# Patient Record
Sex: Female | Born: 1960 | Race: White | Hispanic: No | Marital: Married | Smoking: Never smoker
Health system: Southern US, Community
[De-identification: ages and names within clinical notes are randomized; demographics above are authoritative.]

## PROBLEM LIST (undated history)

## (undated) ENCOUNTER — Emergency Department (HOSPITAL_COMMUNITY): Payer: Self-pay

## (undated) DIAGNOSIS — C4491 Basal cell carcinoma of skin, unspecified: Secondary | ICD-10-CM

## (undated) DIAGNOSIS — E785 Hyperlipidemia, unspecified: Secondary | ICD-10-CM

## (undated) DIAGNOSIS — K589 Irritable bowel syndrome without diarrhea: Secondary | ICD-10-CM

## (undated) HISTORY — DX: Irritable bowel syndrome, unspecified: K58.9

## (undated) HISTORY — DX: Basal cell carcinoma of skin, unspecified: C44.91

## (undated) HISTORY — PX: AUGMENTATION MAMMAPLASTY: SUR837

## (undated) HISTORY — PX: ABDOMINAL HYSTERECTOMY: SHX81

## (undated) HISTORY — DX: Hyperlipidemia, unspecified: E78.5

---

## 1998-01-05 ENCOUNTER — Ambulatory Visit (HOSPITAL_BASED_OUTPATIENT_CLINIC_OR_DEPARTMENT_OTHER): Admission: RE | Admit: 1998-01-05 | Discharge: 1998-01-05 | Payer: Self-pay | Admitting: Plastic Surgery

## 1998-03-26 ENCOUNTER — Ambulatory Visit (HOSPITAL_BASED_OUTPATIENT_CLINIC_OR_DEPARTMENT_OTHER): Admission: RE | Admit: 1998-03-26 | Discharge: 1998-03-26 | Payer: Self-pay | Admitting: Plastic Surgery

## 1999-08-06 ENCOUNTER — Ambulatory Visit (HOSPITAL_COMMUNITY): Admission: RE | Admit: 1999-08-06 | Discharge: 1999-08-06 | Payer: Self-pay | Admitting: Urology

## 1999-08-06 ENCOUNTER — Encounter: Payer: Self-pay | Admitting: Urology

## 2000-01-20 ENCOUNTER — Other Ambulatory Visit: Admission: RE | Admit: 2000-01-20 | Discharge: 2000-01-20 | Payer: Self-pay | Admitting: Plastic Surgery

## 2000-01-20 ENCOUNTER — Encounter (INDEPENDENT_AMBULATORY_CARE_PROVIDER_SITE_OTHER): Payer: Self-pay | Admitting: Specialist

## 2000-07-26 ENCOUNTER — Other Ambulatory Visit: Admission: RE | Admit: 2000-07-26 | Discharge: 2000-07-26 | Payer: Self-pay | Admitting: Obstetrics and Gynecology

## 2000-07-31 ENCOUNTER — Encounter: Admission: RE | Admit: 2000-07-31 | Discharge: 2000-07-31 | Payer: Self-pay | Admitting: Obstetrics and Gynecology

## 2000-07-31 ENCOUNTER — Encounter: Payer: Self-pay | Admitting: Obstetrics and Gynecology

## 2000-08-22 HISTORY — PX: LAPAROSCOPIC HYSTERECTOMY: SHX1926

## 2000-10-19 ENCOUNTER — Ambulatory Visit (HOSPITAL_COMMUNITY): Admission: RE | Admit: 2000-10-19 | Discharge: 2000-10-19 | Payer: Self-pay | Admitting: Gastroenterology

## 2001-03-29 ENCOUNTER — Encounter (INDEPENDENT_AMBULATORY_CARE_PROVIDER_SITE_OTHER): Payer: Self-pay

## 2001-03-29 ENCOUNTER — Observation Stay (HOSPITAL_COMMUNITY): Admission: RE | Admit: 2001-03-29 | Discharge: 2001-03-30 | Payer: Self-pay | Admitting: Obstetrics and Gynecology

## 2001-04-05 ENCOUNTER — Inpatient Hospital Stay (HOSPITAL_COMMUNITY): Admission: AD | Admit: 2001-04-05 | Discharge: 2001-04-08 | Payer: Self-pay | Admitting: Obstetrics and Gynecology

## 2001-10-08 ENCOUNTER — Encounter: Payer: Self-pay | Admitting: Emergency Medicine

## 2001-10-08 ENCOUNTER — Emergency Department (HOSPITAL_COMMUNITY): Admission: EM | Admit: 2001-10-08 | Discharge: 2001-10-08 | Payer: Self-pay | Admitting: Emergency Medicine

## 2004-05-17 ENCOUNTER — Ambulatory Visit (HOSPITAL_COMMUNITY): Admission: RE | Admit: 2004-05-17 | Discharge: 2004-05-17 | Payer: Self-pay | Admitting: Urology

## 2004-06-21 ENCOUNTER — Other Ambulatory Visit: Admission: RE | Admit: 2004-06-21 | Discharge: 2004-06-21 | Payer: Self-pay | Admitting: Obstetrics and Gynecology

## 2004-08-22 HISTORY — PX: LAPAROSCOPIC CHOLECYSTECTOMY: SUR755

## 2005-01-04 ENCOUNTER — Ambulatory Visit (HOSPITAL_COMMUNITY): Admission: RE | Admit: 2005-01-04 | Discharge: 2005-01-04 | Payer: Self-pay | Admitting: Gastroenterology

## 2005-01-14 ENCOUNTER — Ambulatory Visit (HOSPITAL_COMMUNITY): Admission: RE | Admit: 2005-01-14 | Discharge: 2005-01-14 | Payer: Self-pay | Admitting: Gastroenterology

## 2005-01-27 ENCOUNTER — Observation Stay (HOSPITAL_COMMUNITY): Admission: RE | Admit: 2005-01-27 | Discharge: 2005-01-28 | Payer: Self-pay | Admitting: Surgery

## 2005-01-27 ENCOUNTER — Encounter (INDEPENDENT_AMBULATORY_CARE_PROVIDER_SITE_OTHER): Payer: Self-pay | Admitting: *Deleted

## 2005-09-06 ENCOUNTER — Encounter: Admission: RE | Admit: 2005-09-06 | Discharge: 2005-09-06 | Payer: Self-pay | Admitting: Obstetrics and Gynecology

## 2006-06-20 ENCOUNTER — Encounter: Admission: RE | Admit: 2006-06-20 | Discharge: 2006-06-20 | Payer: Self-pay | Admitting: Gastroenterology

## 2006-09-07 ENCOUNTER — Encounter: Admission: RE | Admit: 2006-09-07 | Discharge: 2006-09-07 | Payer: Self-pay | Admitting: Obstetrics and Gynecology

## 2007-09-18 ENCOUNTER — Encounter: Admission: RE | Admit: 2007-09-18 | Discharge: 2007-09-18 | Payer: Self-pay | Admitting: Obstetrics and Gynecology

## 2007-10-22 ENCOUNTER — Ambulatory Visit (HOSPITAL_COMMUNITY): Admission: RE | Admit: 2007-10-22 | Discharge: 2007-10-22 | Payer: Self-pay | Admitting: Urology

## 2008-01-07 ENCOUNTER — Ambulatory Visit (HOSPITAL_COMMUNITY): Admission: RE | Admit: 2008-01-07 | Discharge: 2008-01-07 | Payer: Self-pay | Admitting: Urology

## 2008-06-13 ENCOUNTER — Ambulatory Visit (HOSPITAL_COMMUNITY): Admission: RE | Admit: 2008-06-13 | Discharge: 2008-06-13 | Payer: Self-pay | Admitting: Urology

## 2008-09-18 ENCOUNTER — Encounter: Admission: RE | Admit: 2008-09-18 | Discharge: 2008-09-18 | Payer: Self-pay | Admitting: Obstetrics and Gynecology

## 2009-03-06 ENCOUNTER — Ambulatory Visit: Payer: Self-pay | Admitting: Vascular Surgery

## 2009-06-19 ENCOUNTER — Ambulatory Visit: Payer: Self-pay | Admitting: Vascular Surgery

## 2009-08-05 ENCOUNTER — Ambulatory Visit: Payer: Self-pay | Admitting: Vascular Surgery

## 2009-08-11 ENCOUNTER — Ambulatory Visit: Payer: Self-pay | Admitting: Vascular Surgery

## 2009-09-21 ENCOUNTER — Encounter: Admission: RE | Admit: 2009-09-21 | Discharge: 2009-09-21 | Payer: Self-pay | Admitting: Obstetrics and Gynecology

## 2010-10-25 ENCOUNTER — Other Ambulatory Visit: Payer: Self-pay | Admitting: Obstetrics and Gynecology

## 2010-10-25 DIAGNOSIS — Z1231 Encounter for screening mammogram for malignant neoplasm of breast: Secondary | ICD-10-CM

## 2010-11-05 ENCOUNTER — Ambulatory Visit
Admission: RE | Admit: 2010-11-05 | Discharge: 2010-11-05 | Disposition: A | Discharge status: Home / Self Care | Payer: PRIVATE HEALTH INSURANCE | Source: Ambulatory Visit | Attending: Obstetrics and Gynecology | Admitting: Obstetrics and Gynecology

## 2010-11-05 DIAGNOSIS — Z1231 Encounter for screening mammogram for malignant neoplasm of breast: Secondary | ICD-10-CM

## 2011-01-04 NOTE — Procedures (Signed)
LOWER EXTREMITY VENOUS REFLUX EXAM   INDICATION:  Right lower extremity varicose vein with pain and swelling.   EXAM:  Using color-flow imaging and pulse spectral Doppler analysis, the  right common femoral vein, superficial femoral vein, popliteal,  posterior tibial, greater and lesser saphenous veins are evaluated.  There is no evidence suggesting deep venous insufficiency in the right  lower extremity.   The right saphenofemoral junction is competent.  The right GSV is  competent.   The right proximal short saphenous vein demonstrates incompetency.    GSV Diameter (used if found to be incompetent only)                                            Right    Left  Proximal Greater Saphenous Vein           cm       cm  Proximal-to-mid-thigh                     cm       cm  Mid thigh                                 cm       cm  Mid-distal thigh                          cm       cm  Distal thigh                              cm       cm  Knee                                      cm       cm    IMPRESSION:  1. No evidence of reflux noted in the right greater saphenous vein.  2. The right greater saphenous vein is not aneurysmal.  3. The right greater saphenous vein is not tortuous.  4. The deep venous system is competent.  5. The right lesser saphenous vein is not competent with reflux of      >500 milliseconds with the caliber ranging from 0.29 to 0.96 cm      distal thigh to popliteal fossa.  6. No evidence of DVT noted in the right leg.        ___________________________________________  Larina Earthly, M.D.   MG/MEDQ  D:  03/06/2009  T:  03/06/2009  Job:  [id number]

## 2011-01-04 NOTE — Procedures (Signed)
DUPLEX DEEP VENOUS EXAM - LOWER EXTREMITY   INDICATION:  Follow up right short saphenous vein ablation.   HISTORY:  Edema:  Trauma/Surgery:  Short saphenous vein ablation, 08/05/09 by Dr. Arbie Cookey.  Pain:  Minimal aching.  PE:  No.  Previous DVT:  No.  Anticoagulants:  No.  Other:   DUPLEX EXAM:                CFV   SFV   PopV  PTV    GSV                R  L  R  L  R  L  R   L  R  L  Thrombosis    o     o     o     o      o  Spontaneous   +     +     +     +      +  Phasic        +     +     +     +      +  Augmentation  +     +     +     +      +  Compressible  +     +     +     +      +  Competent     +     +     +     +      +   Legend:  + - yes  o - no  p - partial  D - decreased   IMPRESSION:  1. No evidence of deep venous thrombosis in the right lower extremity.  2. Right short saphenous vein shows evidence of ablation without flow      from proximal to mid.  3. 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    _____________________________  Quita Skye. Hart [place], M.D.   AS/MEDQ  D:  08/11/2009  T:  08/12/2009  Job:  [id number]

## 2011-01-04 NOTE — Assessment & Plan Note (Signed)
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  She underwent  uneventful ablation of her right small saphenous vein and phlebectomy of  multiple tributaries.  She had no immediate complication and will be  seen again in 1 week for continued followup.     Larina Earthly, M.D.  Electronically Signed   TFE/MEDQ  D:  08/05/2009  T:  08/06/2009  Job:  [id number]

## 2011-01-04 NOTE — Assessment & Plan Note (Signed)
OFFICE VISIT   EARLENE, BJELLAND A  DOB:  1961-07-18                                       06/19/2009  ZOXWR#:[id number]   Tamerra Merkley presents today for continued follow-up of her venous  hypertension and varicosities in her left leg.  She reports that the  compression garments have given her no relief.  She has several  components.  She does have specific pain over the varicosities  themselves in her posterior left calf and also has also a tired and achy  sensation of her calf with prolonged sitting or prolonged standing.  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   REVIEW OF SYSTEMS:  Otherwise unchanged.   PHYSICAL EXAMINATION:  She does have tributary varicosities over her  posterior calf.  I re-imaged this with ultrasound, and this does show  that this arises from her incompetent small saphenous vein.  I discussed  options with Ms. [place].  She clearly has failed conservative treatment.  I have recommended laser ablation of her small saphenous vein and stab  phlebectomy of her tributary varicosities.  I explained the procedure  including the outpatient nature of this under local and with the unusual  potential complication of DVT which would be extremely unlikely.  We  will schedule her at her convenience.   Larina Earthly, M.D.  Electronically Signed   TFE/MEDQ  D:  06/19/2009  T:  06/22/2009  Job:  [id number]

## 2011-01-04 NOTE — Consult Note (Signed)
NEW PATIENT CONSULTATION   Jasmine [name], Jasmine [name]  DOB:  [birth year]                                       03/06/2009  MVHQI#:[id number]   The patient presents today for evaluation of right calf venous  pathology.  She has had [name] long history of tributary varicosities over  her right calf and reported in the past that this has become  increasingly uncomfortable and in the past several days had marked pain  in her calf.  She is concerned regarding potential DVT and is seen for  further evaluation.  She does report aching sensation over this area and  also swelling and bulging of the veins in the posterior calf.  She  elevates her legs when possible.  She has not worn graduated compression  garments.   PAST MEDICAL HISTORY:  Otherwise, completely unremarkable.  No major  medical difficulties.  On no meds.   SOCIAL HISTORY:  She is married.  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  She does not smoke or drink alcohol on [name] regular basis.   PHYSICAL EXAM:  Well-developed, well-nourished white female appearing  stated age of 33.  Her radial and dorsalis pedis pulses are 2+  bilaterally.  She has no evidence of varicosities in her left leg.  In  her right leg, she does have [name] nest of varicosities in her right  posterior calf with some tenderness over these.   DIAGNOSTIC STUDIES:  She underwent venous duplex today and this reveals  reflux throughout her right small saphenous vein.  There is no evidence  of DVT and no evidence of deep venous valvular incompetence.   MEDICAL DECISION MAKING:  I discussed options with the patient.  I  explained that this pain is related with superficial venous  incompetence.  I explained the options, which would include elevation.  I did stress to her the importance of ibuprofen for discomfort, and also  fitted her with thigh high graduated compression garments, and  instructed her on the use of these.  We will see her again in 3  months  to determine if this conservative treatment is assisting in her pain  management.   Larina Earthly, M.D.  Electronically Signed   TFE/MEDQ  D:  03/06/2009  T:  03/09/2009  Job:  [id number]   cc:   Soyla Murphy. Renne Crigler, M.D.

## 2011-01-04 NOTE — Assessment & Plan Note (Signed)
OFFICE VISIT   [name], Jasmine A  DOB:  07-02-1961                                       08/11/2009  KVQQV#:[id number]   The patient returns today having had laser ablation of her right small  saphenous vein with multiple stab phlebectomies by Dr. Arbie Cookey 1 week ago.  She has had minimal discomfort and has been wearing elastic compression  stocking.  She has had no distal edema.  She is beginning to walk on the  treadmill with no problems.  On exam she has some bruising in the calf  as one would expect and all stab phlebectomy sites are healing nicely  with no distal edema.  Minimal tenderness over the small saphenous vein.  Venous duplex reveals no evidence of deep venous obstruction with total  ablation of the small saphenous vein.  She was reassured regarding these  findings and will continue wearing the stocking for at least one more  week and return to see [place] on a p.r.n. basis.     Quita Skye Hart [place], M.D.  Electronically Signed   JDL/MEDQ  D:  08/11/2009  T:  08/12/2009  Job:  [id number]

## 2011-01-07 NOTE — H&P (Signed)
[facility]  Patient:    Jasmine Green, Jasmine Green                      MRN: [id number] Adm. Date:  03/29/01 Attending:  Guy Sandifer. Arleta Creek, M.D.                         History and Physical  CHIEF COMPLAINT:              Uterine fibroid.  HISTORY OF PRESENT ILLNESS:   This patient is a 50 year old, white married female, G3, P3, husband status post vasectomy with known uterine leiomyomata. She is having progressively heavier menstrual flows. She changes a pad and a tampon every two hours. In addition, she is having significant increases in pelvic pain and dysmenorrhea. This takes her off of her feet during her menses. Ultrasound on March 08, 2001, revealed a 5.5 cm fibroid with central degeneration. Ovaries are essentially normal. After a careful discussion of the options, the patient is being admitted for a laparoscopically-assisted vaginal hysterectomy and removal of an ovary only if distinctly abnormal.  PAST MEDICAL HISTORY:         PVCs in the past.  PAST SURGICAL HISTORY:        Negative  OBSTETRIC HISTORY:            Cesarean section x 3.  SOCIAL HISTORY:               The patient denies tobacco, alcohol, or drug abuse.  CURRENT MEDICATIONS:          Elavil 10 mg p.o. q.h.s. p.r.n.  ALLERGIES:                    CODEINE.  REVIEW OF SYSTEMS:            Negative, except as above.  FAMILY HISTORY:               Prostate cancer in father. Coronary artery disease in father.  PHYSICAL EXAMINATION:  VITAL SIGNS:                  Height 5 feet 3 inches, weight 116 pounds, blood pressure 120/78.  HEENT/NECK:                   Without thyromegaly.  LUNGS:                        Clear to auscultation.  HEART:                        Regular rate and rhythm.  BACK:                         Without CVA tenderness.  BREASTS:                      Without mass, tracts, or discharge.  ABDOMEN:                      Soft, nontender, without  masses.  PELVIC:                       Vulva, vagina, cervix without lesion. Uterus is consistent with a 5 to 6 cm left fundal fibroid. Uterus is otherwise mobile. Adnexa nontender without masses.  EXTREMITIES:                  Grossly within normal limits.  NEUROLOGICAL:                 Grossly within normal limits.  ASSESSMENT:                   Uterine leiomyomata.  PLAN:                         Laparoscopically-assisted vaginal hysterectomy and removal of an ovary only if distinctly abnormal. DD:  03/27/01 TD:  03/27/01 Job: [id number]

## 2011-01-07 NOTE — Op Note (Signed)
[facility]  Patient:    Jasmine Green, Jasmine Green                    MRN: [id number] Proc. Date: 03/29/01 Adm. Date:  [id number] Attending:  Cordelia Pen Ii                           Operative Report  PREOPERATIVE DIAGNOSIS:       Uterine leiomyomata.  POSTOPERATIVE DIAGNOSES:      1. Uterine leiomyomata.                               2. Endometriosis.  PROCEDURE:                    Laparoscopically-assisted vaginal                               hysterectomy with ablation of endometriosis.  SURGEON:                      Guy Sandifer. Arleta Creek, M.D.  ASSISTANTWilley Blade, M.D.  ANESTHESIA:                   General with endotracheal intubation.  ESTIMATED BLOOD LOSS:         200 cc.  INDICATIONS AND CONSENT:      This patient is a 50 year old, married white female, G3, P3, husband status post vasectomy with known uterine leiomyomata. She has increasing pelvic pain, dysmenorrhea, and menorrhagia. Details are dictated in the history and physical. Laparoscopically-assisted vaginal hysterectomy with removal of an ovary only if distinctly abnormal is discussed. The possible risks and complications are discussed including, but not limited to, infection, bowel, bladder, ureteral damage, bleeding requiring transfusion of blood products with possible transfusion reaction, HIV and hepatitis acquisition, DVT, PE, pneumonia, fistula formation, postoperative dyspareunia, and laparotomy. All questions are answered and consent is signed on the chart.  FINDINGS:                     Upper abdomen is grossly normal. There are some filmy adhesions of the sigmoid to the upper left pelvic brim. The uterus is enlarged with intramural leiomyoma to approximately 10- to 12-week size. Ovaries and tubes are normal bilaterally. Anterior cul-de-sac contains some scarification secondary to previous cesarean sections along the vesicouterine peritoneum.  The pelvic sidewall contains some red, raised lesions of endometriosis on the left side well above the course of the ureter. On the right pelvic sidewall there is some white scarification, possibly secondary to endometriosis. In the posterior cul-de-sac there are several 3 to 5 mm red implants of endometriosis. Course of the ureters is normal bilaterally.  DESCRIPTION OF PROCEDURE:     The patient is taken to the operating room, placed in the dorsal supine position where general anesthesia is induced via endotracheal intubation. She is then placed in the dorsal lithotomy position where she is prepped abdominally and vaginally. A Hulka tenaculum is placed in the uterus as a manipulator and she is draped in the sterile fashion. A small infraumbilical incision is made and a 10/11 disposable trocar sleeve is placed on the first attempt  without difficulty. Placement is verified with the laparoscope and no damage to surrounding structures is noted. Pneumoperitoneum is induced and a suprapubic, and later left lower quadrant, incisions are made after careful transillumination and 5 mm nondisposable trocar sleeves are placed under direct visualization without difficulty. The above findings are noted. The areas of endometriosis are cauterized with bipolar cautery. The adhesions to the left pelvic brim are taken down sharply without difficulty. Then, using the 5 mm laparoscope through the left lower trocar sleeve, the proximal ligaments are taken down bilaterally with the LigaSure instrument. Good hemostasis is maintained. Then, switching back to the operative laparoscope, the vesicouterine peritoneum is incised in the midline, hydrodissected, and taken down bilaterally. Instruments are removed and attention is then turned to the vagina.  A weighted speculum is placed and the posterior cul-de-sac is entered sharply without difficulty. The cervix is circumscribed with a scalpel and the mucosa is  advanced sharply and bluntly. Again, using the LigaSure instrument, the uterosacral ligaments are taken in progressive bites bilaterally. The bladder pillars are then taken bilaterally. The anterior cul-de-sac is then entered sharply and bluntly without difficulty. The remainder of the cardinal ligaments and the uterine arteries are then taken bilaterally. Two additional bites above this level are taken bilaterally. The fundus is then eventually delivered posteriorly. The proximal ligaments are taken down with the LigaSure and the specimen is delivered. A wet tape is placed and the uterosacral ligaments are plicated to the vagina bilaterally. The ligaments are then plicated in the midline with a single suture. The cuff is then closed with figure-of-eight sutures. All sutures are 0 Monocryl unless otherwise designated. A Foley catheter is placed in the bladder and clear urine is noted. Then returning attention to the laparoscope, the pneumoperitoneum is reintroduced and a minor amount of oozing at the peritoneal edges is controlled with bipolar cautery. Copious irrigation is carried out and all the fluid returns as clear. The suprapubic and left lower trocar sleeves are removed and the pneumoperitoneum is reduced and no bleeding is noted from any site. The pneumoperitoneum is completely reduced and the umbilical trocar sleeve is removed. The umbilical incision is closed with a 0 Vicryl suture in the deeper underlying layers with great care being taken not to pick up any underlying structures. Skin incisions are then closed with a subcuticular 3-0 Vicryl suture. The incisions are injected with 0.5% plain Marcaine. All counts are correct. The patient is awakened and taken to the recovery room in stable condition. DD:  03/29/01 TD:  03/29/01 Job: [id number]

## 2011-01-07 NOTE — Procedures (Signed)
[facility]  Patient:    Jasmine Green, Jasmine Green                    MRN: [id number] Proc. Date: 10/19/00 Adm. Date:  [id number] Attending:  Charna Elizabeth CC:         Guy Sandifer. Arleta Creek, M.D.                           Procedure Report  DATE OF BIRTH:  [birth year].  PROCEDURE:  Colonoscopy.  ENDOSCOPIST:  Anselmo Rod, M.D.  INSTRUMENT USED:  Olympus video colonoscope.  INDICATION FOR PROCEDURE:  Trace guaiac-positive stools and abdominal pain with history of diarrhea alternating with constipation in a 50 year old white female.  Rule out colonic polyps, masses, hemorrhoids, etc.  PREPROCEDURE PREPARATION:  Informed consent was procured from the patient. The patient was fasted for eight hours prior to the procedure and prepped with Fleets Phospho-Soda the night of the procedure.  She also maintained herself on a liquid diet for 48 hours prior to the procedure.  PREPROCEDURE PHYSICAL:  VITAL SIGNS:  The patient had stable vital signs.  NECK:  Supple.  CHEST:  Clear to auscultation.  S1, S2 regular.  ABDOMEN:  Soft with normal abdominal bowel sounds.  DESCRIPTION OF PROCEDURE:  The patient was placed in the left lateral decubitus position and sedated with 100 mg of Demerol and 10 mg of Versed intravenously.  Once the patient was adequately sedate and maintained on low-flow oxygen and continuous cardiac monitoring, the Olympus video colonoscope was advanced from the rectum to the cecum with extreme difficulty secondary to a very tortuous colon and large amount of solid stool in the colon.  Multiple washes were done to facilitate adequate visualization of the mucosa.  The portions of the mucosa that were visualized seemed healthy. There was no evidence of erosions, ulcerations, masses, or polyps.  The procedure as complete to the cecum.  The terminal ileum was briefly visualized and appeared normal but as there was some stool in the TI as  well, very small lesions cannot be definitely ruled out.  The appendiceal orifice and ileocecal valve, the cecum, right colon, transverse colon, and rectal mucosa all appeared healthy.  There were small, nonbleeding internal hemorrhoids seen on retroflexion.  The patient tolerated the procedure well without complications.  IMPRESSION: 1. Very tortuous colon. 2. Large amount of residual stool in the colon.  Very small lesions can be    missed; however, no masses, polyps, erosions, ulcerations, or diverticula    were seen.  No evidence of inflammatory bowel disease, erosions,    ulcerations seen. 3. Terminal ileum briefly visualized.  Large amount of stool in the terminal    ileum.  No gross lesions seen; however, very small lesions could be missed.  RECOMMENDATIONS: 1. High-fiber diet with liberal fluid intake has been advocated. 2. I would refrain from recommending antispasmodics for this patient because    of a history of chronic constipation. 3. Patient may benefit from a stool softener at bedtime like Colace 100 mg per    night. 4. Outpatient follow-up is advised in the next two to four weeks. DD:  10/19/00 TD:  10/19/00 Job: [id number]

## 2011-01-07 NOTE — Op Note (Signed)
NAME:  Jasmine Green, Jasmine Green             ACCOUNT NO.:  [id number]   MEDICAL RECORD NO.:  [id number]          PATIENT TYPE:  AMB   LOCATION:  DAY                          FACILITY:  [facility]   PHYSICIAN:  Velora Heckler, MD      DATE OF BIRTH:  1960-08-24   DATE OF PROCEDURE:  01/27/2005  DATE OF DISCHARGE:                                 OPERATIVE REPORT   PREOPERATIVE DIAGNOSIS:  Biliary dyskinesia   POSTOPERATIVE DIAGNOSES:  Biliary dyskinesia, chronic cholecystitis   PROCEDURE:  Laparoscopic cholecystectomy with intraoperative cholangiography   SURGEON:  Velora Heckler, M.D.   ASSISTANT:  None.   ANESTHESIA:  General per Quentin Cornwall. Council Mechanic, M.D.   ESTIMATED BLOOD LOSS:  Minimal.   PREPARATION:  Betadine.   COMPLICATIONS:  None.   INDICATIONS:  The patient is a 50 year old white female referred by Anselmo Rod, M.D. for abdominal pain. The patient has had long-standing  irritable bowel syndrome. She has had some minor gastroesophageal reflux  symptoms. Recently, she has developed the sudden onset of epigastric  abdominal pain. This seems to be related to fatty food intake. The patient  has had several discrete episodes. She underwent abdominal ultrasound which  was normal. Hepatobiliary scan, however, was performed. This showed a low  ejection fraction of 6% at 30 minutes and 10% at 60 minutes. The patient  also had reproduction of her symptoms with administration of  cholecystokinin. She now comes to surgery for cholecystectomy for treatment  of biliary dyskinesia.   BODY OF REPORT:  The procedure was done in OR #11 at the [facility]. The patient was brought to the operating room, placed in  supine position on the operating room table. Following administration of  general anesthesia, the patient was prepped and draped in the usual strict  aseptic fashion. After ascertaining that an adequate level of anesthesia had  been obtained, an infraumbilical incision  was reopened with a #15 blade.  Dissection was carried down to the fascia. The fascia was incised in the  midline. The peritoneal cavity was entered cautiously. A 0 Vicryl  pursestring suture was placed in the fascia. An Hasson cannula was  introduced and secured with the pursestring suture. The abdomen was  insufflated with carbon dioxide. The laparoscope was introduced and the  abdomen explored. Representative photographs were taken for the medical  record. The patient has a normal-appearing liver. The gallbladder is  elevated and has relatively dense adhesions of omentum to the undersurface  of the gallbladder. Operative ports were placed along the right costal  margin in the midline, midclavicular line, and the anterior axillary line.  The fundus of the gallbladder was grasped and retracted cephalad. Adhesions  were taken down with the electrocautery used for hemostasis. Dissection was  carried down to the neck of the gallbladder. Peritoneum was incised at the  neck of the gallbladder, and the cystic duct is dissected out along its  length. A clip was placed at the neck of the gallbladder. The cystic artery  was doubly clipped and divided. The cystic duct was incised. Cloudy  brown  bile emanated from the cystic duct. A Cook cholangiography catheter was  introduced through a stab wound in the right upper quadrant. It was inserted  into the cystic duct and secured with a Ligaclip. Using C-arm fluoroscopy,  real-time cholangiography was performed. There was rapid filling of a normal  caliber common bile duct. There was reflux of contrast into the right and  left hepatic ductal systems. There was free flow of contrast distally into  the duodenum without filling defect or obstruction. The clip was withdrawn,  and Cook catheter was removed from the peritoneal cavity. The cystic duct  was triply clipped and divided. The gallbladder was then dissected out of  the gallbladder bed using the [facility]  electrocautery for hemostasis. A  posterior venous tributary was divided between Hemoclips. The gallbladder  was excised up to its apex. There is a small rest of the liver on the medial  aspect of the gallbladder wall which was also excised with the specimen. At  the fundus of the gallbladder, a clip was again placed across the peritoneum  and the gallbladder completely excised. The gallbladder was placed into an  EndoCatch bag and withdrawn through the umbilical port. On palpation, it  does not contain gallstones. The right upper quadrant was irrigated with  warm saline which was evacuated. Good hemostasis was noted. Pneumoperitoneum  was released. Ports were removed. All port sites were anesthetized with  local anesthetic. The 0 Vicryl pursestring was tied securely at the  umbilicus. All wounds were closed with interrupted 4-0 Vicryl subcuticular  sutures. The wounds are washed and dried, and Benzoin and Steri-Strips were  applied. Sterile dressings were applied. The patient was awakened from  anesthesia and brought to the recovery room in stable condition. The patient  tolerated the procedure well.       TMG/MEDQ  D:  01/27/2005  T:  01/27/2005  Job:  [id number]   cc:   Anselmo Rod, M.D.  [address]  [address]  [place]  [place] [address]  Fax: [phone]   Soyla Murphy. Renne Crigler, M.D.  [address]  [place]  [place] [address]  Fax: [phone]   Velora Heckler, MD  [phone] [address] [place]  [place] [address]

## 2011-01-07 NOTE — Discharge Summary (Signed)
[facility]  Patient:    Jasmine Green, Jasmine Green                    MRN: [id number] Adm. Date:  [id number] Disc. Date: 03/30/01 Attending:  Cordelia Pen Ii                           Discharge Summary  ADMITTING DIAGNOSIS:          Uterine leiomyomata.  DISCHARGE DIAGNOSES:          1. Uterine leiomyomata.                               2. Endometriosis.  PROCEDURE ON March 29, 2001:  Laparoscopically assisted vaginal hysterectomy                               and ablation of endometriosis.  REASON FOR ADMISSION:         This patient is a 50 year old married, white female, G3, P3, husband status post vasectomy, with known uterine leiomyomata.  They are becoming increasingly symptomatic. See history and physical for complete details. Laparoscopically assisted vaginal hysterectomy with removal of any ovary only if distinctly abnormal has been discussed.  HOSPITAL COURSE:              The patient is admitted to the hospital, undergoes the above procedure without complication. Estimated blood loss is 200 cc. On the evening of surgery, she has good pain control, good urine output, and vital signs are stable. On the day of discharge, abdomen is flat and soft, she is passing flatus, has good bowel sounds, and vital signs remain stable and afebrile. On the first postoperative day, white count is 8.5, and hemoglobin is 11.6. She is tolerating regular diet without difficulty.  CONDITION ON DISCHARGE:       Good.  DISCHARGE INSTRUCTIONS:       Diet: Regular as tolerated. Activity: No lifting, no operation of automobiles, no vaginal entry. She is to call the office for problems including, but not limited to, temperature of 101 or greater, increasing pain, heavy vaginal bleeding, or persistent nausea, vomiting.  DISCHARGE FOLLOWUP:           The patient is to follow up in the office in two weeks.  DISCHARGE MEDICATIONS:        1. Percocet 5/325, #30, one  to two p.o. q.6h.                                  p.r.n.                               2. Ibuprofen 600 mg p.o. q.6h. p.r.n.                               3. Multivitamin daily. DD:  03/30/01 TD:  03/30/01 Job: [id number]

## 2011-01-07 NOTE — Discharge Summary (Signed)
[facility]  Patient:    Jasmine Green, Jasmine Green                    MRN: [id number] Adm. Date:  [id number] Disc. Date: [id number] Attending:  Cordelia Pen Ii                           Discharge Summary  ADMITTING DIAGNOSIS:          Postoperative fever, probable cuff cellulitis.  DISCHARGE DIAGNOSIS:          Postoperative fever, probable cuff cellulitis.  REASON FOR ADMISSION:         This patient is a 50 year old, married white female, status post LAVH on April 08, 2001. She was discharged home on the first postoperative day without complication. On the day of admission she complained of the onset of remarkable body ache. She also developed fever. She was given oral hydration and a single dose of oral Levaquin. She was without nausea, vomiting, was passing flatus, and having some bowel movements. She was also complaining of some pelvic pressure-type sensations. Upon evaluation in the triage area, temperature was 101.6. Abdomen was soft with mild suprapubic pain without rebound, tenderness, or masses. She had normal bowel sounds in all four quadrants. Pelvic exam revealed diffuse tenderness without a mass effect. White count was 14.7 with some left shift, and urinalysis was essentially negative.  HOSPITAL COURSE:              The patient was admitted to the hospital and gets IV fluids in Unasyn 3 g/hr. On the first postoperative day, she was still feeling a bit achy and temperature is 99.3. White count is decreasing to 11.9 and hemoglobin is stable at 12.5. Her maximum temperature that day was 100.5. On April 06, 2001 she was feeling a bit better and got some results from a Dulcolax suppository. However, her maximum temperature was 100.6. Abdominal exam was slowly improving and she continued to take her regular diet without difficulty. On April 07, 2001 she was feeling better with a maximum temperature of 100.1 IV antibiotics were discontinued  and she was placed on Augmentin. She also continues on the Levaquin which she had been receiving as well. On the day of discharge, she is completely afebrile, Abdomen is nontender and she is having bowel movements.  CONDITION ON DISCHARGE:       Good.  DIET:                         Regular as tolerated.  ACTIVITY:                     No lifting, no operation of automobiles, no vaginal entry.  DISCHARGE MEDICATIONS:        1. Augmentin 500/125 mg q.12h. for seven days.                               2. Levaquin 250- mg once a day.                               3. Gynazole vaginal cream is given to be  used p.r.n. yeast infection.  DISCHARGE FOLLOWUP:           The patient is to follow up in the office in five days. DD:  04/08/01 TD:  04/08/01 Job: [id number]

## 2011-09-23 ENCOUNTER — Encounter: Payer: Self-pay | Admitting: Internal Medicine

## 2011-10-04 ENCOUNTER — Other Ambulatory Visit: Payer: Self-pay | Admitting: Obstetrics and Gynecology

## 2011-10-04 DIAGNOSIS — Z1231 Encounter for screening mammogram for malignant neoplasm of breast: Secondary | ICD-10-CM

## 2011-10-18 ENCOUNTER — Ambulatory Visit (AMBULATORY_SURGERY_CENTER): Payer: PRIVATE HEALTH INSURANCE | Admitting: *Deleted

## 2011-10-18 ENCOUNTER — Telehealth: Payer: Self-pay | Admitting: *Deleted

## 2011-10-18 ENCOUNTER — Telehealth: Payer: Self-pay | Admitting: Internal Medicine

## 2011-10-18 VITALS — Ht 63.0 in | Wt 123.5 lb

## 2011-10-18 DIAGNOSIS — Z1211 Encounter for screening for malignant neoplasm of colon: Secondary | ICD-10-CM

## 2011-10-18 MED ORDER — PEG-KCL-NACL-NASULF-NA ASC-C 100 G PO SOLR: ORAL | Status: DC

## 2011-10-18 NOTE — Telephone Encounter (Signed)
Patient came for pre visit. She saw Dr. Juanda Chance in 1992 and had a flex sig. She saw Dr. Loreta Ave in 2002 for a colonoscopy and in 2007 for gall bladder issues. She now wants to switch to Dr. Marina Goodell. Is this ok with you Dr. Juanda Chance?

## 2011-10-18 NOTE — Telephone Encounter (Signed)
Dr. Juanda Chance: FYI: Pt is calling back to talk about scheduled colonoscopy.  She says our policy for switching MD's "makes her uncomfortable."  She says she wants to take some more time to think about whether she wants to come to [facility] for her procedure or choose another practice for her procedure.  She requests that we cancel her scheduled colonoscopy. Ezra Sites

## 2011-10-18 NOTE — Telephone Encounter (Signed)
OK with me.

## 2011-10-18 NOTE — Telephone Encounter (Signed)
Also of note, this patient is Dr. Belva Crome wife.

## 2011-10-18 NOTE — Telephone Encounter (Signed)
OK  DB

## 2011-11-01 ENCOUNTER — Encounter: Payer: PRIVATE HEALTH INSURANCE | Admitting: Internal Medicine

## 2011-11-07 ENCOUNTER — Ambulatory Visit
Admission: RE | Admit: 2011-11-07 | Discharge: 2011-11-07 | Disposition: A | Discharge status: Home / Self Care | Payer: PRIVATE HEALTH INSURANCE | Source: Ambulatory Visit | Attending: Obstetrics and Gynecology | Admitting: Obstetrics and Gynecology

## 2011-11-07 DIAGNOSIS — Z1231 Encounter for screening mammogram for malignant neoplasm of breast: Secondary | ICD-10-CM

## 2011-12-07 ENCOUNTER — Emergency Department (HOSPITAL_COMMUNITY)
Admission: EM | Admit: 2011-12-07 | Discharge: 2011-12-07 | Disposition: A | Discharge status: Home / Self Care | Payer: PRIVATE HEALTH INSURANCE | Attending: Emergency Medicine | Admitting: Emergency Medicine

## 2011-12-07 ENCOUNTER — Other Ambulatory Visit: Payer: Self-pay

## 2011-12-07 ENCOUNTER — Encounter (HOSPITAL_COMMUNITY): Payer: Self-pay

## 2011-12-07 ENCOUNTER — Emergency Department (HOSPITAL_COMMUNITY)
Admission: EM | Admit: 2011-12-07 | Discharge: 2011-12-07 | Discharge status: Against Medical Advice | Payer: PRIVATE HEALTH INSURANCE | Attending: Emergency Medicine | Admitting: Emergency Medicine

## 2011-12-07 ENCOUNTER — Emergency Department (HOSPITAL_COMMUNITY): Payer: PRIVATE HEALTH INSURANCE

## 2011-12-07 DIAGNOSIS — R079 Chest pain, unspecified: Secondary | ICD-10-CM

## 2011-12-07 DIAGNOSIS — R0602 Shortness of breath: Secondary | ICD-10-CM | POA: Insufficient documentation

## 2011-12-07 LAB — CBC
HCT: 42.7 % (ref 36.0–46.0)
MCH: 31.5 pg (ref 26.0–34.0)
MCV: 89.1 fL (ref 78.0–100.0)
Platelets: 194 10*3/uL (ref 150–400)
RDW: 12.3 % (ref 11.5–15.5)

## 2011-12-07 LAB — COMPREHENSIVE METABOLIC PANEL
ALT: 13 U/L (ref 0–35)
AST: 20 U/L (ref 0–37)
Alkaline Phosphatase: 87 U/L (ref 39–117)
CO2: 25 mEq/L (ref 19–32)
Calcium: 9.8 mg/dL (ref 8.4–10.5)
GFR calc Af Amer: >90 mL/min (ref >90)
Glucose, Bld: 57 mg/dL — ABNORMAL LOW (ref 70–99)
Sodium: 140 mEq/L (ref 135–145)
Total Protein: 6.7 g/dL (ref 6.0–8.3)

## 2011-12-07 LAB — DIFFERENTIAL
Basophils Absolute: 0 10*3/uL (ref 0.0–0.1)
Basophils Relative: 1 % (ref 0–1)
Eosinophils Absolute: 0 10*3/uL (ref 0.0–0.7)
Lymphocytes Relative: 38 % (ref 12–46)
Lymphs Abs: 1.4 10*3/uL (ref 0.7–4.0)
Monocytes Relative: 7 % (ref 3–12)

## 2011-12-07 NOTE — ED Notes (Signed)
Ambulated to restroom with no problem.

## 2011-12-07 NOTE — ED Provider Notes (Signed)
History     CSN: [id number]  Arrival date & time 12/07/11  [id number]   First MD Initiated Contact with Patient 12/07/11 [phone]      Chief Complaint  Patient presents with  . Chest Pain  . Shortness of Breath    (Consider location/radiation/quality/duration/timing/severity/associated sxs/prior treatment) Patient is a 51 y.o. female presenting with chest pain and shortness of breath. The history is provided by the patient (the pt complains of some chest pain today with sob). No language interpreter was used.  Chest Pain The chest pain began 1 - 2 hours ago. Chest pain occurs intermittently. The chest pain is resolved. The pain is associated with exertion. At its most intense, the pain is at 3/10. The pain is currently at 2/10. The quality of the pain is described as aching. The pain does not radiate. Chest pain is worsened by exertion. Primary symptoms include shortness of breath. Pertinent negatives for primary symptoms include no fatigue, no cough and no abdominal pain.  Pertinent negatives for associated symptoms include no claudication. She tried nothing for the symptoms.  Pertinent negatives for past medical history include no seizures.    Shortness of Breath  Associated symptoms include chest pain and shortness of breath. Pertinent negatives include no cough.    Past Medical History  Diagnosis Date  . Basal cell carcinoma     face, adomen  . IBS (irritable bowel syndrome)     Past Surgical History  Procedure Date  . Cesarean section 1989, 1991, 1994  . Laparoscopic hysterectomy 2002  . Laparoscopic cholecystectomy 2006    Family History  Problem Relation Age of Onset  . Colon cancer Neg Hx   . Stomach cancer Neg Hx   . Coronary artery disease Father 19    MI age 30, died of squamous cell cancer  . Hyperlipidemia Father     History  Substance Use Topics  . Smoking status: Never Smoker   . Smokeless tobacco: Never Used  . Alcohol Use: 1.2 oz/week    2 Glasses of wine  per week    OB History    Grav Para Term Preterm Abortions TAB SAB Ect Mult Living                  Review of Systems  Constitutional: Negative for fatigue.  HENT: Negative for congestion, sinus pressure and ear discharge.   Eyes: Negative for discharge.  Respiratory: Positive for shortness of breath. Negative for cough.   Cardiovascular: Positive for chest pain. Negative for claudication.  Gastrointestinal: Negative for abdominal pain and diarrhea.  Genitourinary: Negative for frequency and hematuria.  Musculoskeletal: Negative for back pain.  Skin: Negative for rash.  Neurological: Negative for seizures and headaches.  Hematological: Negative.   Psychiatric/Behavioral: Negative for hallucinations.    Allergies  Codeine  Home Medications   Current Outpatient Rx  Name Route Sig Dispense Refill  . ALPRAZOLAM 0.5 MG PO TABS Oral Take 0.125 mg by mouth at bedtime as needed. Pt takes 1/4 tablet before sleep and before flying    . ASPIRIN 81 MG PO CHEW Oral Chew 324 mg by mouth once. Prior to coming to the hospital    . MULTI-VITAMIN/MINERALS PO TABS Oral Take 1 tablet by mouth daily.      BP 112/74  Pulse 63  Temp(Src) 97.8 F (36.6 C) (Oral)  Resp 16  SpO2 100%  Physical Exam  Constitutional: She is oriented to person, place, and time. She appears well-developed.  HENT:  Head:  Normocephalic and atraumatic.  Eyes: Conjunctivae and EOM are normal. No scleral icterus.  Neck: Neck supple. No thyromegaly present.  Cardiovascular: Normal rate and regular rhythm.  Exam reveals no gallop and no friction rub.   No murmur heard. Pulmonary/Chest: No stridor. She has no wheezes. She has no rales. She exhibits no tenderness.  Abdominal: She exhibits no distension. There is no tenderness. There is no rebound.  Musculoskeletal: Normal range of motion. She exhibits no edema.  Lymphadenopathy:    She has no cervical adenopathy.  Neurological: She is oriented to person, place, and  time. Coordination normal.  Skin: No rash noted. No erythema.  Psychiatric: She has a normal mood and affect. Her behavior is normal.    ED Course  Procedures (including critical care time)  Labs Reviewed  CBC - Abnormal; Notable for the following:    WBC 3.8 (*)    Hemoglobin 15.1 (*)    All other components within normal limits  COMPREHENSIVE METABOLIC PANEL - Abnormal; Notable for the following:    Glucose, Bld 57 (*)    All other components within normal limits  DIFFERENTIAL  TROPONIN I  D-DIMER, QUANTITATIVE   Dg Chest 2 View  12/07/2011  *RADIOLOGY REPORT*  Clinical Data: Shortness of breath.  CHEST - 2 VIEW  Comparison: 06/13/2008  Findings: Heart and mediastinal contours are within normal limits. No focal opacities or effusions.  No acute bony abnormality.  IMPRESSION: No active cardiopulmonary disease.  Original Report Authenticated By: Cyndie Chime, M.D.   Date: 12/07/2011  Rate: 75  Rhythm: normal sinus rhythm  QRS Axis: normal  Intervals: normal  ST/T Wave abnormalities: normal  Conduction Disutrbances:none  Narrative Interpretation:   Old EKG Reviewed: none available     1. Chest pain    Pt seen by cardiology and she will be sent home for follow up    MDM          Benny Lennert, MD 12/07/11 1247

## 2011-12-07 NOTE — ED Notes (Signed)
Dr Antoine Poche at bedside for consult. Pt denies needs.

## 2011-12-07 NOTE — Consult Note (Signed)
CARDIOLOGY CONSULT  NOTE  Patient ID: Jasmine Green MRN: [id number] DOB/AGE: 02/09/1961 51 y.o.  Admit date: 12/07/2011 Primary Cardiologist   Dr. Sw[place]and Chief Complaint    Chest pain, dyspnea  HPI:   The patient presents for evaluation of dizziness and shortness of breath. She has a history of PVCs years ago. She did have a negative stress test about 2 years ago she reports normal. I don't have these results. She does have a family history of early coronary artery disease. She was in her usual state of health this morning when she developed some shortness of breath while eating. She had a little pressure in her mid to lower sternal area she had some mild aching in her left shoulder. She's had some of these symptoms with irritable bowel syndrome in the past. She walked up stairs and noticed some more dyspnea. She was able to talk while walking up the stairs. She did have an some tachycardia palpitations and thought her heart was going fast but she could not quantify this. She knew it was not irregular and not like previous PVCs.  she's otherwise been doing relatively well. She's had a little allergy this year that she hasn't had previously. She's otherwise been able to do her activities including exercise without significant limitations. He denies any PND or orthopnea. She's had no weight gain or edema. She's had no presyncope or syncope. He's had no calf swelling or tenderness.   Past Medical History  Diagnosis Date  . Basal cell carcinoma     face, adomen  . IBS (irritable bowel syndrome)     Past Surgical History  Procedure Date  . Cesarean section 1989, 1991, 1994  . Laparoscopic hysterectomy 2002  . Laparoscopic cholecystectomy 2006    Allergies  Allergen Reactions  . Codeine     dizzy   No current facility-administered medications on file prior to encounter.   No current outpatient prescriptions on file prior to encounter.   History   Social History  . Marital  Status: Married    Spouse Name: N/A    Number of Children: 3  . Years of Education: N/A   Occupational History  . Volunteers    Social History Main Topics  . Smoking status: Never Smoker   . Smokeless tobacco: Never Used  . Alcohol Use: 1.2 oz/week    2 Glasses of wine per week  . Drug Use: No  . Sexually Active: Not on file   Other Topics Concern  . Not on file   Social History Narrative   Lives at home with her husband.    Family History  Problem Relation Age of Onset  . Colon cancer Neg Hx   . Stomach cancer Neg Hx   . Coronary artery disease Father 68    MI age 24, died of squamous cell cancer  . Hyperlipidemia Father     ROS:  As stated in the HPI and negative for all other systems.  Physical Exam: Blood pressure 119/71, pulse 81, temperature 97.8 F (36.6 C), temperature source Oral, resp. rate 21, SpO2 100.00%.  GENERAL:  Well appearing HEENT:  Pupils equal round and reactive, fundi not visualized, oral mucosa unremarkable NECK:  No jugular venous distention, waveform within normal limits, carotid upstroke brisk and symmetric, no bruits, no thyromegaly LYMPHATICS:  No cervical, inguinal adenopathy LUNGS:  Clear to auscultation bilaterally BACK:  No CVA tenderness CHEST:  Unremarkable HEART:  PMI not displaced or sustained,S1 and S2 within normal  limits, no S3, no S4, no clicks, no rubs, no murmurs ABD:  Flat, positive bowel sounds normal in frequency in pitch, no bruits, no rebound, no guarding, no midline pulsatile mass, no hepatomegaly, no splenomegaly EXT:  2 plus pulses throughout, no edema, no cyanosis no clubbing SKIN:  No rashes no nodules NEURO:  Cranial nerves II through XII grossly intact, motor grossly intact throughout PSYCH:  Cognitively intact, oriented to person place and time  Labs: Lab Results  Component Value Date   BUN 12 12/07/2011   Lab Results  Component Value Date   CREATININE 0.79 12/07/2011   Lab Results  Component Value Date    NA 140 12/07/2011   K 4.0 12/07/2011   CL 105 12/07/2011   CO2 25 12/07/2011   Lab Results  Component Value Date   TROPONINI <0.30 12/07/2011   Lab Results  Component Value Date   WBC 3.8* 12/07/2011   HGB 15.1* 12/07/2011   HCT 42.7 12/07/2011   MCV 89.1 12/07/2011   PLT 194 12/07/2011   No results found for this basename: CHOL,  HDL,  LDLCALC,  LDLDIRECT,  TRIG,  CHOLHDL   Lab Results  Component Value Date   ALT 13 12/07/2011   AST 20 12/07/2011   ALKPHOS 87 12/07/2011   BILITOT 0.5 12/07/2011    Radiology:  CXR:  No acute disease  EKG:  Sinus rhythm with sinus arrhythmia.  RAE.  No acute ST T wave changes.  ASSESSMENT AND PLAN:    1)  Dyspnea: Her symptoms are somewhat atypical for obstructive coronary disease. She has no high-risk findings. She does have a family history of early heart disease. However, I think the pretest probability of obstructive coronary disease is quite low. I do think he would be reasonable to do a coronary calcium score as an initial screening. I have discussed this with the patient and with Dr. Swaziland. I will arrange this through our office.  2) Palpitations:  She did have some septum a rapid heart rate.   This seems to be the first episode. I will suggest a TSH be drawn. If she has further tachycardia palpitations she did have an event monitor for Holter.   SignedRollene Rotunda 12/07/2011, 12:20 PM

## 2011-12-07 NOTE — ED Notes (Signed)
Pt complains of chest pain and sob onset last night, sts also felt lightheaded, pt sts that she has been trying to control her breathing because her face and hands are tingling. Reports some nausea with pain and rates pain 2/10 pt feels it may be more mentally then physical pain, pt is active at gym approx 3 days a week. Had similar pain a few nights ago and it disappeared.

## 2011-12-07 NOTE — Discharge Instructions (Signed)
Follow up with dr. Antoine Poche

## 2011-12-07 NOTE — ED Notes (Signed)
Patient transported to X-ray by transporter.

## 2011-12-07 NOTE — ED Notes (Signed)
Returned from radiology. 

## 2011-12-08 ENCOUNTER — Other Ambulatory Visit: Payer: Self-pay | Admitting: *Deleted

## 2011-12-13 ENCOUNTER — Ambulatory Visit
Admission: RE | Admit: 2011-12-13 | Discharge: 2011-12-13 | Disposition: A | Discharge status: Home / Self Care | Payer: Self-pay | Source: Ambulatory Visit | Attending: Cardiology | Admitting: Cardiology

## 2011-12-13 DIAGNOSIS — Z8249 Family history of ischemic heart disease and other diseases of the circulatory system: Secondary | ICD-10-CM

## 2012-01-06 ENCOUNTER — Other Ambulatory Visit: Payer: Self-pay | Admitting: Gastroenterology

## 2012-02-27 ENCOUNTER — Ambulatory Visit (HOSPITAL_COMMUNITY)
Admission: RE | Admit: 2012-02-27 | Discharge: 2012-02-27 | Disposition: A | Discharge status: Home / Self Care | Payer: PRIVATE HEALTH INSURANCE | Source: Ambulatory Visit | Attending: Urology | Admitting: Urology

## 2012-02-27 ENCOUNTER — Other Ambulatory Visit: Payer: Self-pay | Admitting: Urology

## 2012-02-27 DIAGNOSIS — M542 Cervicalgia: Secondary | ICD-10-CM

## 2012-02-27 DIAGNOSIS — Z9181 History of falling: Secondary | ICD-10-CM | POA: Insufficient documentation

## 2012-02-27 DIAGNOSIS — M545 Low back pain: Secondary | ICD-10-CM

## 2012-10-03 ENCOUNTER — Other Ambulatory Visit: Payer: Self-pay | Admitting: Obstetrics and Gynecology

## 2012-10-08 ENCOUNTER — Other Ambulatory Visit: Payer: Self-pay | Admitting: Obstetrics and Gynecology

## 2012-10-08 DIAGNOSIS — N644 Mastodynia: Secondary | ICD-10-CM

## 2012-10-23 ENCOUNTER — Ambulatory Visit
Admission: RE | Admit: 2012-10-23 | Discharge: 2012-10-23 | Disposition: A | Discharge status: Home / Self Care | Payer: PRIVATE HEALTH INSURANCE | Source: Ambulatory Visit | Attending: Obstetrics and Gynecology | Admitting: Obstetrics and Gynecology

## 2012-10-23 ENCOUNTER — Other Ambulatory Visit: Payer: Self-pay | Admitting: Obstetrics and Gynecology

## 2012-10-23 DIAGNOSIS — N644 Mastodynia: Secondary | ICD-10-CM

## 2013-09-17 ENCOUNTER — Other Ambulatory Visit: Payer: Self-pay

## 2013-09-17 DIAGNOSIS — Z1231 Encounter for screening mammogram for malignant neoplasm of breast: Secondary | ICD-10-CM

## 2013-09-17 DIAGNOSIS — Z9882 Breast implant status: Secondary | ICD-10-CM

## 2013-10-30 ENCOUNTER — Ambulatory Visit: Payer: PRIVATE HEALTH INSURANCE

## 2013-11-13 ENCOUNTER — Other Ambulatory Visit: Payer: Self-pay

## 2013-11-13 ENCOUNTER — Ambulatory Visit
Admission: RE | Admit: 2013-11-13 | Discharge: 2013-11-13 | Disposition: A | Discharge status: Home / Self Care | Payer: PRIVATE HEALTH INSURANCE | Source: Ambulatory Visit

## 2013-11-13 DIAGNOSIS — Z9882 Breast implant status: Secondary | ICD-10-CM

## 2013-11-13 DIAGNOSIS — Z1231 Encounter for screening mammogram for malignant neoplasm of breast: Secondary | ICD-10-CM

## 2013-11-19 ENCOUNTER — Other Ambulatory Visit: Payer: Self-pay | Admitting: Obstetrics and Gynecology

## 2013-11-19 DIAGNOSIS — R928 Other abnormal and inconclusive findings on diagnostic imaging of breast: Secondary | ICD-10-CM

## 2013-11-26 ENCOUNTER — Ambulatory Visit
Admission: RE | Admit: 2013-11-26 | Discharge: 2013-11-26 | Disposition: A | Discharge status: Home / Self Care | Payer: PRIVATE HEALTH INSURANCE | Source: Ambulatory Visit | Attending: Obstetrics and Gynecology | Admitting: Obstetrics and Gynecology

## 2013-11-26 DIAGNOSIS — R928 Other abnormal and inconclusive findings on diagnostic imaging of breast: Secondary | ICD-10-CM

## 2014-02-18 ENCOUNTER — Other Ambulatory Visit: Payer: Self-pay | Admitting: Gastroenterology

## 2014-02-18 DIAGNOSIS — R1013 Epigastric pain: Secondary | ICD-10-CM

## 2014-02-20 ENCOUNTER — Ambulatory Visit
Admission: RE | Admit: 2014-02-20 | Discharge: 2014-02-20 | Disposition: A | Discharge status: Home / Self Care | Payer: PRIVATE HEALTH INSURANCE | Source: Ambulatory Visit | Attending: Gastroenterology | Admitting: Gastroenterology

## 2014-02-20 DIAGNOSIS — R1013 Epigastric pain: Secondary | ICD-10-CM

## 2014-02-20 MED ORDER — IOHEXOL 300 MG/ML  SOLN
100.0000 mL | Freq: Once | INTRAMUSCULAR | Status: AC | PRN
Start: 2014-02-20 — End: 2014-02-20
  Administered 2014-02-20: 100 mL via INTRAVENOUS

## 2014-02-20 NOTE — Discharge Instructions (Signed)

## 2014-02-26 ENCOUNTER — Other Ambulatory Visit: Payer: Self-pay | Admitting: Gastroenterology

## 2014-03-12 ENCOUNTER — Other Ambulatory Visit: Payer: Self-pay | Admitting: Gastroenterology

## 2014-03-12 DIAGNOSIS — R1013 Epigastric pain: Secondary | ICD-10-CM

## 2014-03-19 ENCOUNTER — Ambulatory Visit
Admission: RE | Admit: 2014-03-19 | Discharge: 2014-03-19 | Disposition: A | Discharge status: Home / Self Care | Payer: PRIVATE HEALTH INSURANCE | Source: Ambulatory Visit | Attending: Gastroenterology | Admitting: Gastroenterology

## 2014-03-19 DIAGNOSIS — R1013 Epigastric pain: Secondary | ICD-10-CM

## 2014-04-08 ENCOUNTER — Ambulatory Visit: Payer: PRIVATE HEALTH INSURANCE | Attending: Obstetrics and Gynecology | Admitting: Physical Therapy

## 2014-04-08 DIAGNOSIS — M629 Disorder of muscle, unspecified: Secondary | ICD-10-CM | POA: Insufficient documentation

## 2014-04-08 DIAGNOSIS — Z9089 Acquired absence of other organs: Secondary | ICD-10-CM | POA: Diagnosis not present

## 2014-04-08 DIAGNOSIS — Z90710 Acquired absence of both cervix and uterus: Secondary | ICD-10-CM | POA: Insufficient documentation

## 2014-04-08 DIAGNOSIS — IMO0001 Reserved for inherently not codable concepts without codable children: Secondary | ICD-10-CM | POA: Insufficient documentation

## 2014-04-08 DIAGNOSIS — IMO0002 Reserved for concepts with insufficient information to code with codable children: Secondary | ICD-10-CM | POA: Insufficient documentation

## 2014-04-08 DIAGNOSIS — M2420 Disorder of ligament, unspecified site: Secondary | ICD-10-CM | POA: Insufficient documentation

## 2014-04-08 DIAGNOSIS — N9489 Other specified conditions associated with female genital organs and menstrual cycle: Secondary | ICD-10-CM | POA: Diagnosis not present

## 2014-04-09 ENCOUNTER — Other Ambulatory Visit: Payer: Self-pay | Admitting: Dermatology

## 2014-04-22 ENCOUNTER — Ambulatory Visit: Payer: PRIVATE HEALTH INSURANCE | Attending: Obstetrics and Gynecology | Admitting: Physical Therapy

## 2014-04-22 DIAGNOSIS — IMO0001 Reserved for inherently not codable concepts without codable children: Secondary | ICD-10-CM | POA: Insufficient documentation

## 2014-04-22 DIAGNOSIS — M2420 Disorder of ligament, unspecified site: Secondary | ICD-10-CM | POA: Diagnosis not present

## 2014-04-22 DIAGNOSIS — Z9089 Acquired absence of other organs: Secondary | ICD-10-CM | POA: Diagnosis not present

## 2014-04-22 DIAGNOSIS — Z90710 Acquired absence of both cervix and uterus: Secondary | ICD-10-CM | POA: Diagnosis not present

## 2014-04-22 DIAGNOSIS — N9489 Other specified conditions associated with female genital organs and menstrual cycle: Secondary | ICD-10-CM | POA: Insufficient documentation

## 2014-04-22 DIAGNOSIS — IMO0002 Reserved for concepts with insufficient information to code with codable children: Secondary | ICD-10-CM | POA: Diagnosis not present

## 2014-04-22 DIAGNOSIS — M629 Disorder of muscle, unspecified: Secondary | ICD-10-CM | POA: Insufficient documentation

## 2014-05-06 ENCOUNTER — Ambulatory Visit: Payer: PRIVATE HEALTH INSURANCE | Admitting: Physical Therapy

## 2014-06-30 ENCOUNTER — Other Ambulatory Visit: Payer: Self-pay | Admitting: Urology

## 2014-06-30 DIAGNOSIS — M25111 Fistula, right shoulder: Secondary | ICD-10-CM

## 2014-07-06 ENCOUNTER — Ambulatory Visit
Admission: RE | Admit: 2014-07-06 | Discharge: 2014-07-06 | Disposition: A | Discharge status: Home / Self Care | Payer: PRIVATE HEALTH INSURANCE | Source: Ambulatory Visit | Attending: Urology | Admitting: Urology

## 2014-07-06 DIAGNOSIS — M25111 Fistula, right shoulder: Secondary | ICD-10-CM

## 2014-09-10 ENCOUNTER — Other Ambulatory Visit: Payer: Self-pay | Admitting: Gastroenterology

## 2014-09-10 DIAGNOSIS — R935 Abnormal findings on diagnostic imaging of other abdominal regions, including retroperitoneum: Secondary | ICD-10-CM

## 2014-09-17 ENCOUNTER — Other Ambulatory Visit: Payer: Self-pay | Admitting: Internal Medicine

## 2014-09-17 ENCOUNTER — Ambulatory Visit
Admission: RE | Admit: 2014-09-17 | Discharge: 2014-09-17 | Disposition: A | Discharge status: Home / Self Care | Payer: PRIVATE HEALTH INSURANCE | Source: Ambulatory Visit | Attending: Internal Medicine | Admitting: Internal Medicine

## 2014-09-17 DIAGNOSIS — G44319 Acute post-traumatic headache, not intractable: Secondary | ICD-10-CM

## 2014-09-23 ENCOUNTER — Encounter (INDEPENDENT_AMBULATORY_CARE_PROVIDER_SITE_OTHER): Payer: Self-pay

## 2014-09-23 ENCOUNTER — Ambulatory Visit
Admission: RE | Admit: 2014-09-23 | Discharge: 2014-09-23 | Disposition: A | Discharge status: Home / Self Care | Payer: PRIVATE HEALTH INSURANCE | Source: Ambulatory Visit | Attending: Gastroenterology | Admitting: Gastroenterology

## 2014-09-23 DIAGNOSIS — R935 Abnormal findings on diagnostic imaging of other abdominal regions, including retroperitoneum: Secondary | ICD-10-CM

## 2014-10-20 ENCOUNTER — Other Ambulatory Visit: Payer: Self-pay

## 2014-10-20 DIAGNOSIS — Z1231 Encounter for screening mammogram for malignant neoplasm of breast: Secondary | ICD-10-CM

## 2014-11-21 ENCOUNTER — Ambulatory Visit
Admission: RE | Admit: 2014-11-21 | Discharge: 2014-11-21 | Disposition: A | Discharge status: Home / Self Care | Payer: PRIVATE HEALTH INSURANCE | Source: Ambulatory Visit

## 2014-11-21 DIAGNOSIS — Z1231 Encounter for screening mammogram for malignant neoplasm of breast: Secondary | ICD-10-CM

## 2014-11-24 ENCOUNTER — Other Ambulatory Visit: Payer: Self-pay | Admitting: Obstetrics and Gynecology

## 2014-11-24 DIAGNOSIS — R928 Other abnormal and inconclusive findings on diagnostic imaging of breast: Secondary | ICD-10-CM

## 2014-11-27 ENCOUNTER — Ambulatory Visit
Admission: RE | Admit: 2014-11-27 | Discharge: 2014-11-27 | Disposition: A | Discharge status: Home / Self Care | Payer: PRIVATE HEALTH INSURANCE | Source: Ambulatory Visit | Attending: Obstetrics and Gynecology | Admitting: Obstetrics and Gynecology

## 2014-11-27 DIAGNOSIS — R928 Other abnormal and inconclusive findings on diagnostic imaging of breast: Secondary | ICD-10-CM

## 2015-05-05 ENCOUNTER — Other Ambulatory Visit: Payer: Self-pay | Admitting: Urology

## 2015-05-05 ENCOUNTER — Ambulatory Visit (HOSPITAL_COMMUNITY)
Admission: RE | Admit: 2015-05-05 | Discharge: 2015-05-05 | Disposition: A | Discharge status: Home / Self Care | Payer: PRIVATE HEALTH INSURANCE | Source: Ambulatory Visit | Attending: Urology | Admitting: Urology

## 2015-05-05 DIAGNOSIS — R05 Cough: Secondary | ICD-10-CM

## 2015-05-05 DIAGNOSIS — R059 Cough, unspecified: Secondary | ICD-10-CM

## 2015-05-18 ENCOUNTER — Ambulatory Visit
Admission: RE | Admit: 2015-05-18 | Discharge: 2015-05-18 | Disposition: A | Discharge status: Home / Self Care | Payer: PRIVATE HEALTH INSURANCE | Source: Ambulatory Visit | Attending: Neurosurgery | Admitting: Neurosurgery

## 2015-05-18 ENCOUNTER — Other Ambulatory Visit: Payer: Self-pay | Admitting: Neurosurgery

## 2015-05-18 DIAGNOSIS — M5412 Radiculopathy, cervical region: Secondary | ICD-10-CM

## 2015-10-16 ENCOUNTER — Other Ambulatory Visit: Payer: Self-pay | Admitting: Gastroenterology

## 2015-10-16 DIAGNOSIS — R932 Abnormal findings on diagnostic imaging of liver and biliary tract: Secondary | ICD-10-CM

## 2015-10-16 DIAGNOSIS — K769 Liver disease, unspecified: Secondary | ICD-10-CM

## 2015-10-19 ENCOUNTER — Other Ambulatory Visit: Payer: Self-pay

## 2015-10-19 DIAGNOSIS — Z1231 Encounter for screening mammogram for malignant neoplasm of breast: Secondary | ICD-10-CM

## 2015-10-27 ENCOUNTER — Ambulatory Visit
Admission: RE | Admit: 2015-10-27 | Discharge: 2015-10-27 | Disposition: A | Discharge status: Home / Self Care | Payer: PRIVATE HEALTH INSURANCE | Source: Ambulatory Visit | Attending: Gastroenterology | Admitting: Gastroenterology

## 2015-10-27 DIAGNOSIS — R932 Abnormal findings on diagnostic imaging of liver and biliary tract: Secondary | ICD-10-CM

## 2015-12-01 ENCOUNTER — Ambulatory Visit
Admission: RE | Admit: 2015-12-01 | Discharge: 2015-12-01 | Disposition: A | Discharge status: Home / Self Care | Payer: PRIVATE HEALTH INSURANCE | Source: Ambulatory Visit

## 2015-12-01 DIAGNOSIS — Z1231 Encounter for screening mammogram for malignant neoplasm of breast: Secondary | ICD-10-CM

## 2016-04-18 ENCOUNTER — Other Ambulatory Visit: Payer: Self-pay | Admitting: Urology

## 2016-04-18 ENCOUNTER — Ambulatory Visit (HOSPITAL_COMMUNITY)
Admission: RE | Admit: 2016-04-18 | Discharge: 2016-04-18 | Disposition: A | Discharge status: Home / Self Care | Payer: PRIVATE HEALTH INSURANCE | Source: Ambulatory Visit | Attending: Urology | Admitting: Urology

## 2016-04-18 DIAGNOSIS — J40 Bronchitis, not specified as acute or chronic: Secondary | ICD-10-CM | POA: Insufficient documentation

## 2016-10-20 ENCOUNTER — Other Ambulatory Visit: Payer: Self-pay | Admitting: Obstetrics and Gynecology

## 2016-10-20 DIAGNOSIS — Z1231 Encounter for screening mammogram for malignant neoplasm of breast: Secondary | ICD-10-CM

## 2016-10-27 ENCOUNTER — Other Ambulatory Visit: Payer: Self-pay | Admitting: Internal Medicine

## 2016-10-27 DIAGNOSIS — Z8249 Family history of ischemic heart disease and other diseases of the circulatory system: Secondary | ICD-10-CM

## 2016-11-02 ENCOUNTER — Ambulatory Visit
Admission: RE | Admit: 2016-11-02 | Discharge: 2016-11-02 | Disposition: A | Discharge status: Home / Self Care | Payer: No Typology Code available for payment source | Source: Ambulatory Visit | Attending: Internal Medicine | Admitting: Internal Medicine

## 2016-11-02 DIAGNOSIS — Z8249 Family history of ischemic heart disease and other diseases of the circulatory system: Secondary | ICD-10-CM

## 2016-12-07 ENCOUNTER — Ambulatory Visit: Payer: PRIVATE HEALTH INSURANCE

## 2016-12-22 ENCOUNTER — Ambulatory Visit: Payer: PRIVATE HEALTH INSURANCE

## 2017-01-11 ENCOUNTER — Ambulatory Visit
Admission: RE | Admit: 2017-01-11 | Discharge: 2017-01-11 | Disposition: A | Discharge status: Home / Self Care | Payer: PRIVATE HEALTH INSURANCE | Source: Ambulatory Visit | Attending: Obstetrics and Gynecology | Admitting: Obstetrics and Gynecology

## 2017-01-11 DIAGNOSIS — Z1231 Encounter for screening mammogram for malignant neoplasm of breast: Secondary | ICD-10-CM

## 2017-06-08 ENCOUNTER — Emergency Department (HOSPITAL_COMMUNITY)
Admission: EM | Admit: 2017-06-08 | Discharge: 2017-06-08 | Disposition: A | Discharge status: Home / Self Care | Payer: PRIVATE HEALTH INSURANCE | Attending: Emergency Medicine | Admitting: Emergency Medicine

## 2017-06-08 ENCOUNTER — Emergency Department (HOSPITAL_COMMUNITY): Payer: PRIVATE HEALTH INSURANCE

## 2017-06-08 ENCOUNTER — Encounter (HOSPITAL_COMMUNITY): Payer: Self-pay | Admitting: *Deleted

## 2017-06-08 DIAGNOSIS — Z79899 Other long term (current) drug therapy: Secondary | ICD-10-CM | POA: Diagnosis not present

## 2017-06-08 DIAGNOSIS — Z85828 Personal history of other malignant neoplasm of skin: Secondary | ICD-10-CM | POA: Diagnosis not present

## 2017-06-08 DIAGNOSIS — R079 Chest pain, unspecified: Secondary | ICD-10-CM | POA: Insufficient documentation

## 2017-06-08 LAB — BASIC METABOLIC PANEL
Anion gap: 10 (ref 5–15)
BUN: 17 mg/dL (ref 6–20)
CO2: 24 mmol/L (ref 22–32)
Calcium: 9.5 mg/dL (ref 8.9–10.3)
Chloride: 103 mmol/L (ref 101–111)
Creatinine, Ser: 0.87 mg/dL (ref 0.44–1.00)
GFR calc Af Amer: >60 mL/min (ref >60)
GFR calc non Af Amer: >60 mL/min (ref >60)
Glucose, Bld: 110 mg/dL — ABNORMAL HIGH (ref 65–99)
Potassium: 4.2 mmol/L (ref 3.5–5.1)
SODIUM: 137 mmol/L (ref 135–145)

## 2017-06-08 LAB — CBC
HCT: 42.6 % (ref 36.0–46.0)
Hemoglobin: 14.8 g/dL (ref 12.0–15.0)
MCH: 31.3 pg (ref 26.0–34.0)
MCHC: 34.7 g/dL (ref 30.0–36.0)
MCV: 90.1 fL (ref 78.0–100.0)
PLATELETS: 238 10*3/uL (ref 150–400)
RBC: 4.73 MIL/uL (ref 3.87–5.11)
RDW: 12.5 % (ref 11.5–15.5)
WBC: 7.5 10*3/uL (ref 4.0–10.5)

## 2017-06-08 LAB — I-STAT TROPONIN, ED
TROPONIN I, POC: 0 ng/mL (ref 0.00–0.08)
Troponin i, poc: 0 ng/mL (ref 0.00–0.08)

## 2017-06-08 NOTE — ED Provider Notes (Signed)
[facility] EMERGENCY DEPARTMENT Provider Note   CSN: [id number] Arrival date & time: 06/08/17  0417     History   Chief Complaint Chief Complaint  Patient presents with  . Chest Pain    HPI Jasmine Green is a 56 y.o. female.  Patient is a 56 year old female with history of irritable bowel and basal cell carcinoma. She presents today for evaluation of chest discomfort. This started between midnight and 12:30 this morning while she was sleeping. She describes this as a pressure in the sternal area with radiation to the right side of her neck. She took some Pepto-Bismol, then went back to sleep. Her pain returnedand decided to come to the ER to be evaluated. She denies any shortness of breath, nausea, diaphoresis. She denies any recent exertional symptoms. She has no prior cardiac history. Her only cardiac risk factors are a family history with her father having an MI in his 40s.  This patient did have a calcium scoring test performed recently which was 0.   The history is provided by the patient.  Chest Pain   This is a new problem. Episode onset: 12:30 AM. The problem occurs constantly. The problem has not changed since onset.The pain is present in the substernal region. The pain is mild. Quality: tightness. Radiates to: right side of neck. Pertinent negatives include no diaphoresis, no lower extremity edema, no nausea, no palpitations and no shortness of breath. She has tried nothing for the symptoms. The treatment provided no relief.  Pertinent negatives for past medical history include no CAD, no PE and no PVD.  Her family medical history is significant for CAD.    Past Medical History:  Diagnosis Date  . Basal cell carcinoma    face, adomen  . IBS (irritable bowel syndrome)     There are no active problems to display for this patient.   Past Surgical History:  Procedure Laterality Date  . ABDOMINAL HYSTERECTOMY    . AUGMENTATION MAMMAPLASTY     bilateral retro pectoral saline implants 1996  . [place]  . LAPAROSCOPIC CHOLECYSTECTOMY  2006  . LAPAROSCOPIC HYSTERECTOMY  2002    OB History    No data available       Home Medications    Prior to Admission medications   Medication Sig Start Date End Date Taking? Authorizing Provider  Estradiol (ESTROGEL) 0.75 MG/1.25 GM (0.06%) topical gel Place 1.25 g onto the skin 3 (three) times a week.  11/25/15  Yes [provider]  magnesium oxide (MAG-OX) 400 MG tablet Take 400 mg by mouth daily.   Yes [provider]  methylPREDNISolone (MEDROL DOSEPAK) 4 MG TBPK tablet Take 4-24 mg by mouth See admin instructions. Take 6 tablets on day 1 then decrease by 1 tablet daily until all taken 06/04/17  Yes [provider]  TURMERIC PO Take 1 tablet by mouth daily.   Yes [provider]    Family History Family History  Problem Relation Age of Onset  . Coronary artery disease Father 62       MI age 68, died of squamous cell cancer  . Hyperlipidemia Father   . Colon cancer Neg Hx   . Stomach cancer Neg Hx     Social History Social History  Substance Use Topics  . Smoking status: Never Smoker  . Smokeless tobacco: Never Used  . Alcohol use 1.2 oz/week    2 Glasses of wine per week  Allergies   Codeine   Review of Systems Review of Systems  Constitutional: Negative for diaphoresis.  Respiratory: Negative for shortness of breath.   Cardiovascular: Positive for chest pain. Negative for palpitations.  Gastrointestinal: Negative for nausea.  All other systems reviewed and are negative.    Physical Exam Updated Vital Signs BP 119/88   Pulse (!) 57   Temp 98.2 F (36.8 C) (Oral)   Resp 16   Ht 5\' 3"  (1.6 m)   Wt 54.4 kg (120 lb)   SpO2 98%   BMI 21.26 kg/m   Physical Exam  Constitutional: She is oriented to person, place, and time. She appears well-developed and well-nourished. No distress.  HENT:  Head:  Normocephalic and atraumatic.  Neck: Normal range of motion. Neck supple.  Cardiovascular: Normal rate and regular rhythm.  Exam reveals no gallop and no friction rub.   No murmur heard. Pulmonary/Chest: Effort normal and breath sounds normal. No respiratory distress. She has no wheezes.  Abdominal: Soft. Bowel sounds are normal. She exhibits no distension. There is no tenderness.  Musculoskeletal: Normal range of motion. She exhibits no edema.  There is no calf tenderness, no swelling, and Homans sign is absent bilaterally.  Neurological: She is alert and oriented to person, place, and time.  Skin: Skin is warm and dry. She is not diaphoretic.  Nursing note and vitals reviewed.    ED Treatments / Results  Labs (all labs ordered are listed, but only abnormal results are displayed) Labs Reviewed  BASIC METABOLIC PANEL  CBC  I-STAT TROPONIN, ED    EKG  EKG Interpretation  Date/Time:  Thursday June 08 2017 04:21:30 EDT Ventricular Rate:  67 PR Interval:  138 QRS Duration: 72 QT Interval:  392 QTC Calculation: 414 R Axis:   80 Text Interpretation:  Normal sinus rhythm Possible Left atrial enlargement Borderline ECG Confirmed by Veryl Speak [phone]) on 06/08/2017 4:42:07 AM       Radiology Dg Chest 2 View  Result Date: 06/08/2017 CLINICAL DATA:  Epigastric pain radiating to the right jaw tonight. Slight cough for 4 days. EXAM: CHEST  2 VIEW COMPARISON:  CT Cardiac 11/02/2016.  Chest 04/18/2016 FINDINGS: Normal heart size and pulmonary vascularity. No focal airspace disease or consolidation in the lungs. No blunting of costophrenic angles. No pneumothorax. Mediastinal contours appear intact. Mild hyperinflation. Bilateral breast implants. IMPRESSION: No active cardiopulmonary disease. Electronically Signed   By: Lucienne Capers M.D.   On: 06/08/2017 04:59    Procedures Procedures (including critical care time)  Medications Ordered in ED Medications - No data to  display   Initial Impression / Assessment and Plan / ED Course  I have reviewed the triage vital signs and the nursing notes.  Pertinent labs & imaging results that were available during my care of the patient were reviewed by me and considered in my medical decision making (see chart for details).  Patient presenting with atypical chest pain with only risk factor of family history. Her workup reveals an EKG with possibly nonspecific T wave abnormalities in V2 and V3 and negative troponin 2. Repeat EKG is unchanged.  I highly doubt a cardiac etiology. She has a heart score of 3. Patient is feeling better and requesting to go home. I see no indication for admission and feel as though this disposition is appropriate. She will be given the follow-up information for the cardiology clinic with whom she can follow-up if her symptoms persist.  Final Clinical Impressions(s) / ED Diagnoses  Final diagnoses:  None    New Prescriptions New Prescriptions   No medications on file     Veryl Speak, MD 06/08/17 [phone]

## 2017-06-08 NOTE — ED Triage Notes (Signed)
C/o chest pain onset 89mn was able to go to sleep chest pain woke her up at 330 states the pain is central chest with radiation to the right side of her neck. And into her back.

## 2017-06-08 NOTE — Discharge Instructions (Signed)
Follow-up with cardiology if your symptoms persist, and return to the ER if symptoms significantly worsen or change.

## 2017-07-19 ENCOUNTER — Other Ambulatory Visit: Payer: Self-pay | Admitting: Orthopedic Surgery

## 2017-07-19 DIAGNOSIS — M7711 Lateral epicondylitis, right elbow: Secondary | ICD-10-CM

## 2017-08-03 ENCOUNTER — Ambulatory Visit
Admission: RE | Admit: 2017-08-03 | Discharge: 2017-08-03 | Disposition: A | Discharge status: Home / Self Care | Payer: PRIVATE HEALTH INSURANCE | Source: Ambulatory Visit | Attending: Orthopedic Surgery | Admitting: Orthopedic Surgery

## 2017-08-03 DIAGNOSIS — M7711 Lateral epicondylitis, right elbow: Secondary | ICD-10-CM

## 2017-10-25 ENCOUNTER — Other Ambulatory Visit: Payer: Self-pay | Admitting: Gastroenterology

## 2017-10-25 DIAGNOSIS — R935 Abnormal findings on diagnostic imaging of other abdominal regions, including retroperitoneum: Secondary | ICD-10-CM

## 2017-11-07 ENCOUNTER — Ambulatory Visit
Admission: RE | Admit: 2017-11-07 | Discharge: 2017-11-07 | Disposition: A | Discharge status: Home / Self Care | Payer: PRIVATE HEALTH INSURANCE | Source: Ambulatory Visit | Attending: Gastroenterology | Admitting: Gastroenterology

## 2017-11-07 DIAGNOSIS — R935 Abnormal findings on diagnostic imaging of other abdominal regions, including retroperitoneum: Secondary | ICD-10-CM

## 2018-06-08 ENCOUNTER — Other Ambulatory Visit: Payer: Self-pay | Admitting: Gastroenterology

## 2018-06-08 DIAGNOSIS — R1310 Dysphagia, unspecified: Secondary | ICD-10-CM

## 2018-06-08 DIAGNOSIS — R1319 Other dysphagia: Secondary | ICD-10-CM

## 2018-06-15 ENCOUNTER — Ambulatory Visit
Admission: RE | Admit: 2018-06-15 | Discharge: 2018-06-15 | Disposition: A | Discharge status: Home / Self Care | Payer: PRIVATE HEALTH INSURANCE | Source: Ambulatory Visit | Attending: Gastroenterology | Admitting: Gastroenterology

## 2018-06-15 DIAGNOSIS — R1310 Dysphagia, unspecified: Secondary | ICD-10-CM

## 2018-06-15 DIAGNOSIS — R1319 Other dysphagia: Secondary | ICD-10-CM

## 2018-07-03 ENCOUNTER — Other Ambulatory Visit: Payer: Self-pay | Admitting: Obstetrics and Gynecology

## 2018-07-03 DIAGNOSIS — Z1231 Encounter for screening mammogram for malignant neoplasm of breast: Secondary | ICD-10-CM

## 2018-07-09 ENCOUNTER — Ambulatory Visit
Admission: RE | Admit: 2018-07-09 | Discharge: 2018-07-09 | Disposition: A | Discharge status: Home / Self Care | Payer: PRIVATE HEALTH INSURANCE | Source: Ambulatory Visit | Attending: Urology | Admitting: Urology

## 2018-07-09 ENCOUNTER — Other Ambulatory Visit: Payer: Self-pay | Admitting: Urology

## 2018-07-09 DIAGNOSIS — M5440 Lumbago with sciatica, unspecified side: Secondary | ICD-10-CM

## 2018-07-09 DIAGNOSIS — M25552 Pain in left hip: Secondary | ICD-10-CM

## 2018-07-09 DIAGNOSIS — M25562 Pain in left knee: Secondary | ICD-10-CM

## 2018-08-13 ENCOUNTER — Ambulatory Visit (INDEPENDENT_AMBULATORY_CARE_PROVIDER_SITE_OTHER): Payer: PRIVATE HEALTH INSURANCE | Admitting: Sports Medicine

## 2018-08-13 ENCOUNTER — Ambulatory Visit: Payer: Self-pay

## 2018-08-13 ENCOUNTER — Encounter: Payer: Self-pay | Admitting: Sports Medicine

## 2018-08-13 VITALS — BP 143/87 | Ht 63.0 in | Wt 120.0 lb

## 2018-08-13 DIAGNOSIS — M25562 Pain in left knee: Secondary | ICD-10-CM

## 2018-08-13 DIAGNOSIS — Z780 Asymptomatic menopausal state: Secondary | ICD-10-CM

## 2018-08-13 DIAGNOSIS — M810 Age-related osteoporosis without current pathological fracture: Secondary | ICD-10-CM

## 2018-08-13 DIAGNOSIS — M8580 Other specified disorders of bone density and structure, unspecified site: Secondary | ICD-10-CM | POA: Insufficient documentation

## 2018-08-13 NOTE — Assessment & Plan Note (Signed)
Good discussion of diet to assist Supplement calcium and Vit D Fit bit and aim for > 7500 steps 90 sec hop exercise  She will follow this with Dr Shelia Media

## 2018-08-13 NOTE — Progress Notes (Signed)
CC: left knee pain  Jasmine Green referred by husband - Dr Irine Seal  Has a sharp burning sensation deep to patella of left knee Does pilates,Gym exercises and treadmill Does not feel pain while working out Pain after sitting Pain particularly when lying with knee straight Sometimes going down stairs  No trauma history  Past Hx Bone density is low and now at -2.5 or so on T score No history of fractures  ROS No swelling No locking  No giving way  Gen. Very fit lady in NAD BP (!) 143/87   Ht 5\' 3"  (1.6 m)   Wt 120 lb (54.4 kg)   BMI 21.26 kg/m   Knee: Left Normal to inspection with no erythema or effusion or obvious bony abnormalities. Palpation normal with no warmth or joint line tenderness or patellar tenderness or condyle tenderness. ROM normal in flexion and extension and lower leg rotation. Ligaments with solid consistent endpoints including ACL, PCL, LCL, MCL. Negative Mcmurray's and provocative meniscal tests. Non painful patellar compression. Patellar and quadriceps tendons unremarkable. Hamstring and quadriceps strength is normal.  Strength testing at hips with abduction and flexion as well as quads was very good  Ultrasound of Left knee  Suprapatellar pouch with no effusion Medial and lateral meniscus normal Quadriceps and patellar tendons normal Trochlear groove with good cartilage She has calcification on undersurface of patella  Impression:  Remote injury to patella leading to calcification and likely patellofemoral symptoms  Ultrasound and interpretation by Wolfgang Phoenix. Oneida Alar, MD

## 2018-08-13 NOTE — Assessment & Plan Note (Signed)
I reassured her that this is not serious May have periodic sxs but avoid too much knee bend when sitting Careful with leg in fully extended position  Keep up pilates and strength workouts

## 2018-08-20 ENCOUNTER — Inpatient Hospital Stay: Admission: RE | Admit: 2018-08-20 | Payer: PRIVATE HEALTH INSURANCE | Source: Ambulatory Visit

## 2018-09-18 ENCOUNTER — Ambulatory Visit
Admission: RE | Admit: 2018-09-18 | Discharge: 2018-09-18 | Disposition: A | Discharge status: Home / Self Care | Payer: PRIVATE HEALTH INSURANCE | Source: Ambulatory Visit | Attending: Obstetrics and Gynecology | Admitting: Obstetrics and Gynecology

## 2018-09-18 DIAGNOSIS — Z1231 Encounter for screening mammogram for malignant neoplasm of breast: Secondary | ICD-10-CM

## 2019-02-28 ENCOUNTER — Telehealth: Payer: Self-pay | Admitting: General Practice

## 2019-02-28 DIAGNOSIS — Z20822 Contact with and (suspected) exposure to covid-19: Secondary | ICD-10-CM

## 2019-02-28 NOTE — Telephone Encounter (Signed)
Called pt, pt has been scheduled for Covid testing.

## 2019-02-28 NOTE — Telephone Encounter (Signed)
Festus Holts with [facility] with Dr. Everlene Farrier called in to request Covid testing for pt.  Festus Holts info:- [phone]  HUO [phone]

## 2019-02-28 NOTE — Addendum Note (Signed)
Addended by: Dimple Nanas on: 02/28/2019 01:27 PM   Modules accepted: Orders

## 2019-03-01 ENCOUNTER — Other Ambulatory Visit: Payer: PRIVATE HEALTH INSURANCE

## 2019-03-01 DIAGNOSIS — Z20822 Contact with and (suspected) exposure to covid-19: Secondary | ICD-10-CM

## 2019-03-07 LAB — NOVEL CORONAVIRUS, NAA: SARS-CoV-2, NAA: NOT DETECTED

## 2019-03-13 ENCOUNTER — Telehealth: Payer: Self-pay

## 2019-03-13 NOTE — Telephone Encounter (Signed)
Pt. Called back, given COVID 19 results. Verbalizes understanding.

## 2019-03-24 ENCOUNTER — Encounter (HOSPITAL_BASED_OUTPATIENT_CLINIC_OR_DEPARTMENT_OTHER): Payer: Self-pay | Admitting: Emergency Medicine

## 2019-03-24 ENCOUNTER — Emergency Department (HOSPITAL_BASED_OUTPATIENT_CLINIC_OR_DEPARTMENT_OTHER)
Admission: EM | Admit: 2019-03-24 | Discharge: 2019-03-24 | Disposition: A | Discharge status: Home / Self Care | Payer: PRIVATE HEALTH INSURANCE | Attending: Emergency Medicine | Admitting: Emergency Medicine

## 2019-03-24 ENCOUNTER — Other Ambulatory Visit: Payer: Self-pay

## 2019-03-24 DIAGNOSIS — Z79899 Other long term (current) drug therapy: Secondary | ICD-10-CM | POA: Diagnosis not present

## 2019-03-24 DIAGNOSIS — R197 Diarrhea, unspecified: Secondary | ICD-10-CM | POA: Diagnosis not present

## 2019-03-24 DIAGNOSIS — R509 Fever, unspecified: Secondary | ICD-10-CM | POA: Insufficient documentation

## 2019-03-24 DIAGNOSIS — M7918 Myalgia, other site: Secondary | ICD-10-CM | POA: Diagnosis not present

## 2019-03-24 DIAGNOSIS — Z20828 Contact with and (suspected) exposure to other viral communicable diseases: Secondary | ICD-10-CM | POA: Insufficient documentation

## 2019-03-24 DIAGNOSIS — M791 Myalgia, unspecified site: Secondary | ICD-10-CM

## 2019-03-24 LAB — URINALYSIS, MICROSCOPIC (REFLEX)

## 2019-03-24 LAB — URINALYSIS, ROUTINE W REFLEX MICROSCOPIC
Bilirubin Urine: NEGATIVE
Glucose, UA: NEGATIVE mg/dL
Ketones, ur: NEGATIVE mg/dL
Leukocytes,Ua: NEGATIVE
Nitrite: NEGATIVE
Protein, ur: NEGATIVE mg/dL
Specific Gravity, Urine: >1.03 — ABNORMAL HIGH (ref 1.005–1.030)
pH: 5.5 (ref 5.0–8.0)

## 2019-03-24 LAB — SARS CORONAVIRUS 2 BY RT PCR (HOSPITAL ORDER, PERFORMED IN ~~LOC~~ HOSPITAL LAB): SARS Coronavirus 2: NEGATIVE

## 2019-03-24 NOTE — ED Notes (Signed)
ED Provider at bedside. 

## 2019-03-24 NOTE — Discharge Instructions (Signed)
It was our pleasure to provide your ER care today - we hope that you feel better.  Drink plenty of fluids. Take acetaminophen or ibuprofen as need for fever and body aches.  Follow up with primary care doctor in 1 week if symptoms fail to improve/resolve.  Return to ER if worse, new symptoms, increased trouble breathing, persistent vomiting, new or severe pain, other concern.

## 2019-03-24 NOTE — ED Provider Notes (Signed)
[facility] EMERGENCY DEPARTMENT Provider Note   CSN: [id number] Arrival date & time: 03/24/19  1158     History   Chief Complaint Chief Complaint  Patient presents with  . Fever    HPI Jasmine Green is a 58 y.o. female.     Patient presents requesting covid testing. Pt with recent low grade fevers, body aches, malaise, mild diarrhea. Symptoms acute onset a few days ago. States a month ago, while hiking, did have a circular, erythematous lesion to left thigh, no tick seen - states had testing for tick-related illness which subsequently returned negative, and did complete a 10 day course of doxycycline.  Pt reports a negative covid test approximately 1 month ago. Most recent symptoms present in past few days.   The history is provided by the patient.  Fever Associated symptoms: diarrhea, myalgias and sore throat   Associated symptoms: no chest pain, no confusion, no cough, no dysuria, no headaches, no rash and no vomiting     Past Medical History:  Diagnosis Date  . Basal cell carcinoma    face, adomen  . IBS (irritable bowel syndrome)     Patient Active Problem List   Diagnosis Date Noted  . Left anterior knee pain 08/13/2018  . Osteopenia after menopause 08/13/2018    Past Surgical History:  Procedure Laterality Date  . ABDOMINAL HYSTERECTOMY    . AUGMENTATION MAMMAPLASTY     bilateral retro pectoral saline implants 1996  . [place]  . LAPAROSCOPIC CHOLECYSTECTOMY  2006  . LAPAROSCOPIC HYSTERECTOMY  2002     OB History   No obstetric history on file.      Home Medications    Prior to Admission medications   Medication Sig Start Date End Date Taking? Authorizing Provider  Estradiol (ESTROGEL) 0.75 MG/1.25 GM (0.06%) topical gel Place 1.25 g onto the skin 3 (three) times a week.  11/25/15   [provider]  magnesium oxide (MAG-OX) 400 MG tablet Take 400 mg by mouth daily.    [provider]   methylPREDNISolone (MEDROL DOSEPAK) 4 MG TBPK tablet Take 4-24 mg by mouth See admin instructions. Take 6 tablets on day 1 then decrease by 1 tablet daily until all taken 06/04/17   [provider]  TURMERIC PO Take 1 tablet by mouth daily.    [provider]    Family History Family History  Problem Relation Age of Onset  . Coronary artery disease Father 44       MI age 21, died of squamous cell cancer  . Hyperlipidemia Father   . Breast cancer Mother   . Colon cancer Neg Hx   . Stomach cancer Neg Hx     Social History Social History   Tobacco Use  . Smoking status: Never Smoker  . Smokeless tobacco: Never Used  Substance Use Topics  . Alcohol use: Yes    Alcohol/week: 2.0 standard drinks    Types: 2 Glasses of wine per week  . Drug use: No     Allergies   Codeine   Review of Systems Review of Systems  Constitutional: Positive for fever.  HENT: Positive for sore throat.   Eyes: Negative for redness.  Respiratory: Negative for cough and shortness of breath.   Cardiovascular: Negative for chest pain.  Gastrointestinal: Positive for diarrhea. Negative for abdominal pain and vomiting.  Genitourinary: Negative for dysuria.  Musculoskeletal: Positive for myalgias. Negative for neck pain and neck stiffness.  Skin: Negative for rash.  Neurological: Negative for headaches.  Hematological: Negative for adenopathy.  Psychiatric/Behavioral: Negative for confusion.     Physical Exam Updated Vital Signs BP (!) 149/84 (BP Location: Left Arm)   Pulse 81   Temp 98.3 F (36.8 C) (Oral)   Resp 14   Ht 1.6 m (5\' 3" )   Wt 56.2 kg   SpO2 100%   BMI 21.97 kg/m   Physical Exam Vitals signs and nursing note reviewed.  Constitutional:      Appearance: Normal appearance. She is well-developed.  HENT:     Head: Atraumatic.     Nose: Nose normal.     Mouth/Throat:     Mouth: Mucous membranes are moist.     Pharynx: Oropharynx is clear. No oropharyngeal  exudate or posterior oropharyngeal erythema.  Eyes:     General: No scleral icterus.    Conjunctiva/sclera: Conjunctivae normal.  Neck:     Musculoskeletal: Normal range of motion and neck supple. No neck rigidity or muscular tenderness.     Trachea: No tracheal deviation.     Comments: No stiffness/rigidity Cardiovascular:     Rate and Rhythm: Normal rate and regular rhythm.     Pulses: Normal pulses.     Heart sounds: Normal heart sounds. No murmur. No friction rub. No gallop.   Pulmonary:     Effort: Pulmonary effort is normal. No respiratory distress.     Breath sounds: Normal breath sounds.  Abdominal:     General: Bowel sounds are normal. There is no distension.     Palpations: Abdomen is soft.     Tenderness: There is no abdominal tenderness. There is no guarding.  Genitourinary:    Comments: No cva tenderness.  Musculoskeletal:        General: No swelling.  Skin:    General: Skin is warm and dry.     Findings: No rash.  Neurological:     Mental Status: She is alert.     Comments: Alert, speech normal.   Psychiatric:        Mood and Affect: Mood normal.      ED Treatments / Results  Labs (all labs ordered are listed, but only abnormal results are displayed) Results for orders placed or performed during the hospital encounter of 03/24/19  SARS Coronavirus 2 [facility] order, Performed in [facility] hospital lab) Nasopharyngeal Nasopharyngeal Swab   Specimen: Nasopharyngeal Swab  Result Value Ref Range   SARS Coronavirus 2 NEGATIVE NEGATIVE  Urinalysis, Routine w reflex microscopic  Result Value Ref Range   Color, Urine YELLOW YELLOW   APPearance CLEAR CLEAR   Specific Gravity, Urine >1.030 (H) 1.005 - 1.030   pH 5.5 5.0 - 8.0   Glucose, UA NEGATIVE NEGATIVE mg/dL   Hgb urine dipstick MODERATE (A) NEGATIVE   Bilirubin Urine NEGATIVE NEGATIVE   Ketones, ur NEGATIVE NEGATIVE mg/dL   Protein, ur NEGATIVE NEGATIVE mg/dL   Nitrite NEGATIVE NEGATIVE    Leukocytes,Ua NEGATIVE NEGATIVE  Urinalysis, Microscopic (reflex)  Result Value Ref Range   RBC / HPF 0-5 0 - 5 RBC/hpf   WBC, UA 0-5 0 - 5 WBC/hpf   Bacteria, UA MANY (A) NONE SEEN   Squamous Epithelial / LPF 6-10 0 - 5    EKG None  Radiology No results found.  Procedures Procedures (including critical care time)  Medications Ordered in ED Medications - No data to display   Initial Impression / Assessment and Plan / ED Course  I have  reviewed the triage vital signs and the nursing notes.  Pertinent labs & imaging results that were available during my care of the patient were reviewed by me and considered in my medical decision making (see chart for details).  covid swab sent.  Discussed labs/blood work w pt - she reports her spouse did complete set blood work last week and was normal, so will defer.   Reviewed nursing notes and prior charts for additional history.   Labs reviewed by me - covid neg.  Discussed w pt.  rec pcp f/u.  Return precautions provided.     Final Clinical Impressions(s) / ED Diagnoses   Final diagnoses:  None    ED Discharge Orders    None       Lajean Saver, MD 03/24/19 1358

## 2019-03-24 NOTE — ED Triage Notes (Signed)
Pt c/o fever, sore throat, diarrhea, altered taste x 2 days.

## 2019-05-15 ENCOUNTER — Other Ambulatory Visit: Payer: Self-pay

## 2019-05-15 ENCOUNTER — Ambulatory Visit (HOSPITAL_COMMUNITY)
Admission: RE | Admit: 2019-05-15 | Discharge: 2019-05-15 | Disposition: A | Discharge status: Home / Self Care | Payer: PRIVATE HEALTH INSURANCE | Source: Ambulatory Visit | Attending: Urology | Admitting: Urology

## 2019-05-15 ENCOUNTER — Other Ambulatory Visit (HOSPITAL_COMMUNITY): Payer: Self-pay | Admitting: Urology

## 2019-05-15 DIAGNOSIS — M545 Low back pain, unspecified: Secondary | ICD-10-CM

## 2019-05-28 ENCOUNTER — Other Ambulatory Visit: Payer: Self-pay

## 2019-05-28 DIAGNOSIS — Z20822 Contact with and (suspected) exposure to covid-19: Secondary | ICD-10-CM

## 2019-05-31 LAB — NOVEL CORONAVIRUS, NAA: SARS-CoV-2, NAA: NOT DETECTED

## 2019-07-11 ENCOUNTER — Other Ambulatory Visit: Payer: Self-pay

## 2019-07-11 DIAGNOSIS — Z20822 Contact with and (suspected) exposure to covid-19: Secondary | ICD-10-CM

## 2019-07-13 LAB — NOVEL CORONAVIRUS, NAA: SARS-CoV-2, NAA: NOT DETECTED

## 2019-08-09 ENCOUNTER — Other Ambulatory Visit: Payer: Self-pay

## 2019-08-09 ENCOUNTER — Ambulatory Visit: Payer: PRIVATE HEALTH INSURANCE | Attending: Internal Medicine

## 2019-08-09 DIAGNOSIS — Z20822 Contact with and (suspected) exposure to covid-19: Secondary | ICD-10-CM

## 2019-08-10 LAB — NOVEL CORONAVIRUS, NAA: SARS-CoV-2, NAA: NOT DETECTED

## 2019-09-27 ENCOUNTER — Other Ambulatory Visit: Payer: Self-pay | Admitting: Obstetrics and Gynecology

## 2019-09-27 DIAGNOSIS — Z1231 Encounter for screening mammogram for malignant neoplasm of breast: Secondary | ICD-10-CM

## 2019-10-26 ENCOUNTER — Ambulatory Visit: Payer: PRIVATE HEALTH INSURANCE | Attending: Internal Medicine

## 2019-10-26 DIAGNOSIS — Z23 Encounter for immunization: Secondary | ICD-10-CM

## 2019-10-26 NOTE — Progress Notes (Signed)
   Covid-19 Vaccination Clinic  Name:  Jasmine Green    MRN: [id number] DOB: [birth year]  10/26/2019  Jasmine Green was observed post Covid-19 immunization for 15 minutes without incident. She was provided with Vaccine Information Sheet and instruction to access the V-Safe system.   Jasmine Green was instructed to call 911 with any severe reactions post vaccine: Marland Kitchen Difficulty breathing  . Swelling of face and throat  . A fast heartbeat  . A bad rash all over body  . Dizziness and weakness   Immunizations Administered    Name Date Dose VIS Date Route   Pfizer COVID-19 Vaccine 10/26/2019  3:23 PM 0.3 mL 08/02/2019 Intramuscular   Manufacturer: [place]   Lot: [id number]   [place]: [id number]

## 2019-11-04 ENCOUNTER — Ambulatory Visit: Payer: PRIVATE HEALTH INSURANCE

## 2019-11-26 ENCOUNTER — Ambulatory Visit: Payer: PRIVATE HEALTH INSURANCE | Attending: Internal Medicine

## 2019-11-26 DIAGNOSIS — Z23 Encounter for immunization: Secondary | ICD-10-CM

## 2019-11-26 NOTE — Progress Notes (Signed)
   Covid-19 Vaccination Clinic  Name:  Jasmine Green    MRN: [id number] DOB: 1961-08-17  11/26/2019  Jasmine Green was observed post Covid-19 immunization for 15 minutes without incident. She was provided with Vaccine Information Sheet and instruction to access the V-Safe system.   Jasmine Green was instructed to call 911 with any severe reactions post vaccine: Marland Kitchen Difficulty breathing  . Swelling of face and throat  . A fast heartbeat  . A bad rash all over body  . Dizziness and weakness   Immunizations Administered    Name Date Dose VIS Date Route   Pfizer COVID-19 Vaccine 11/26/2019 10:44 AM 0.3 mL 08/02/2019 Intramuscular   Manufacturer: [place]   Lot: [id number]   [place]: [id number]

## 2019-12-24 IMAGING — RF DG ESOPHAGUS
8 series · 14 of 24 positions shown · non-contrast
Comparison: None.

CLINICAL DATA: Esophageal dysphagia.

EXAM:
ESOPHOGRAM / BARIUM SWALLOW / BARIUM TABLET STUDY
TECHNIQUE: Combined double contrast and single contrast examination performed
using effervescent crystals, thick barium liquid, and thin barium
liquid. The patient was observed with fluoroscopy swallowing a 13 mm
barium sulphate tablet.
FLUOROSCOPY TIME:  Fluoroscopy Time:  1 minutes 36 seconds
Radiation Exposure Index (if provided by the fluoroscopic device):
13 mGy.

[Series 1: sequence · 2 of 23 frames shown (1 of 6)]
[frame 4/23]
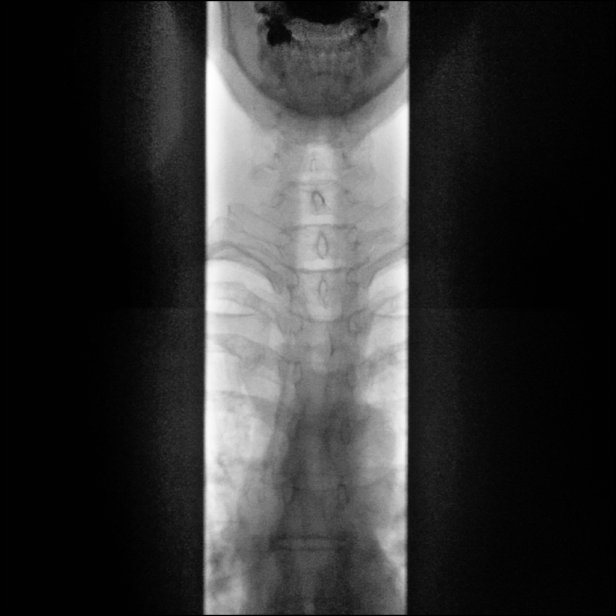
[frame 20/23]
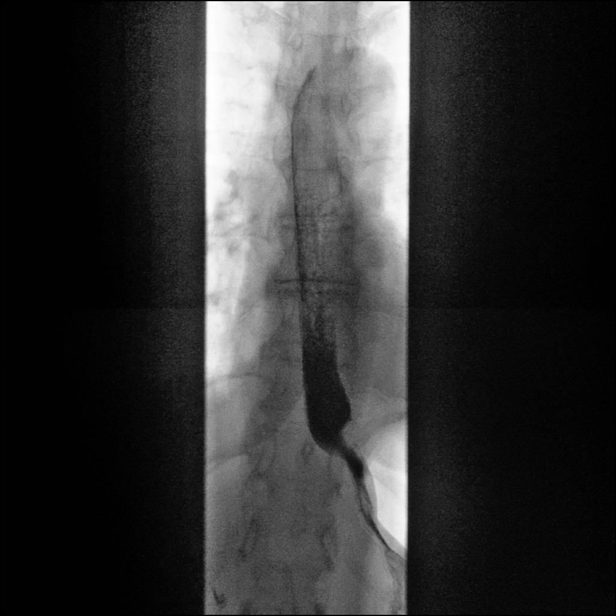

[Series 2: sequence · 1 of 22 frames shown (2 of 6)]
[frame 12/22]
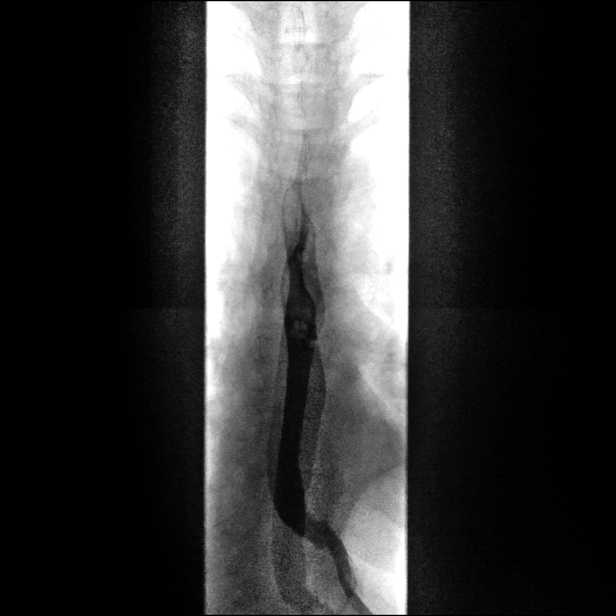

[Series 3: sequence · 2 of 8 frames shown (3 of 6)]
[frame 2/8]
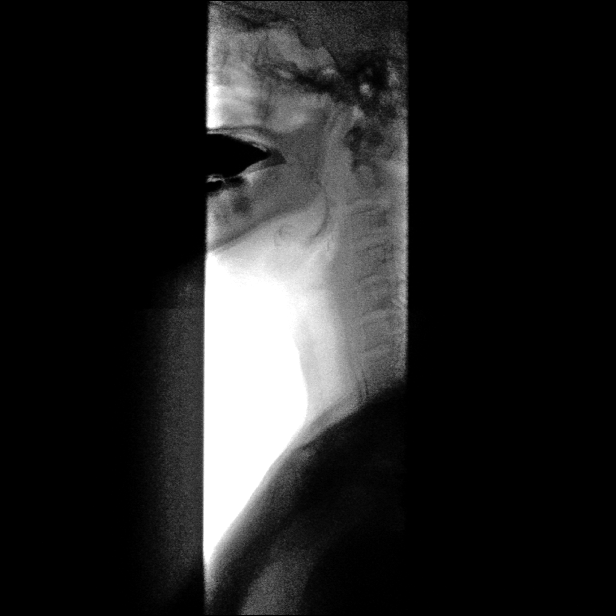
[frame 5/8]
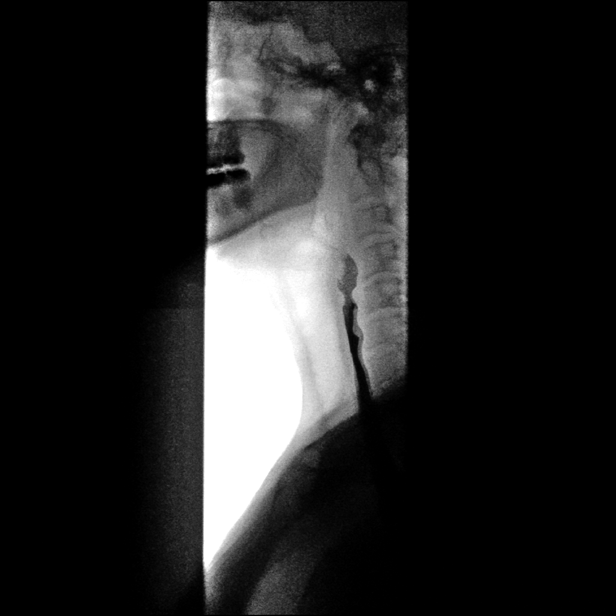

[Series 4: sequence · 2 of 12 frames shown (4 of 6)]
[frame 1/12]
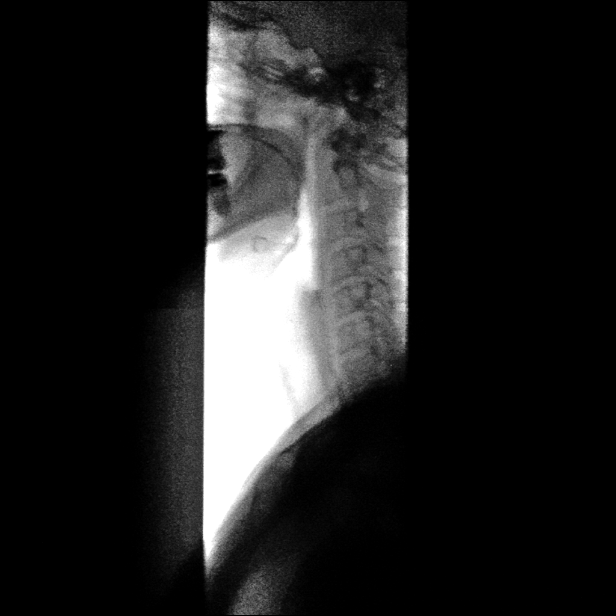
[frame 11/12]
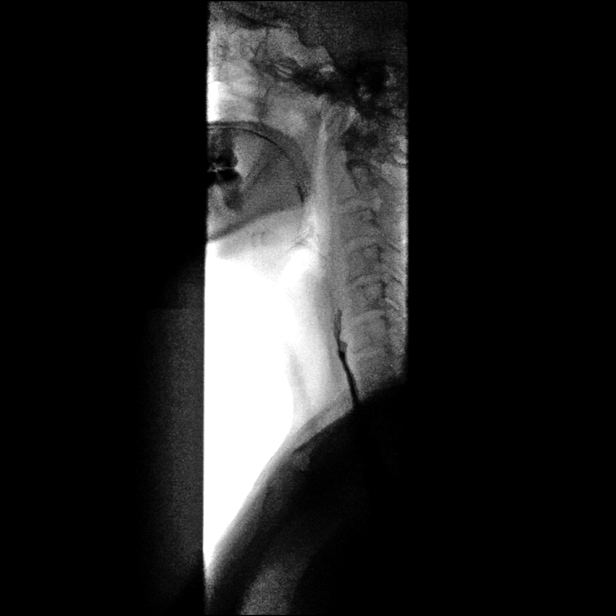

[Series 5: one shot · 0.14mm/px · 1 of 3 slices shown (1 of 2)]
[im 1/3]
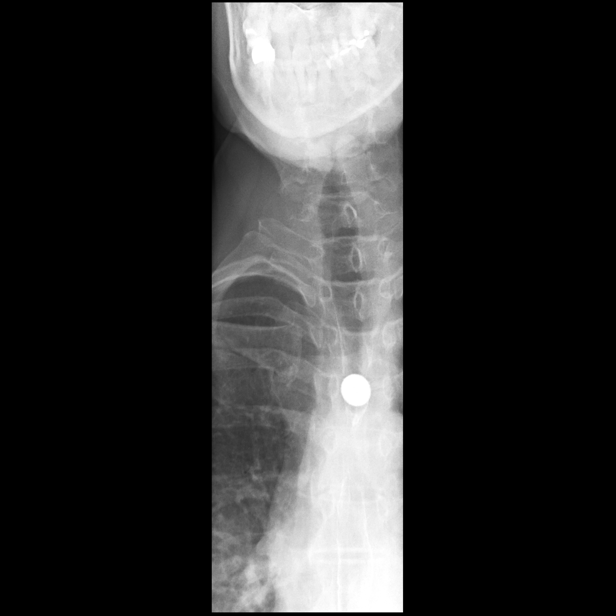

[Series 6: sequence · 2 of 30 frames shown (5 of 6)]
[frame 5/30]
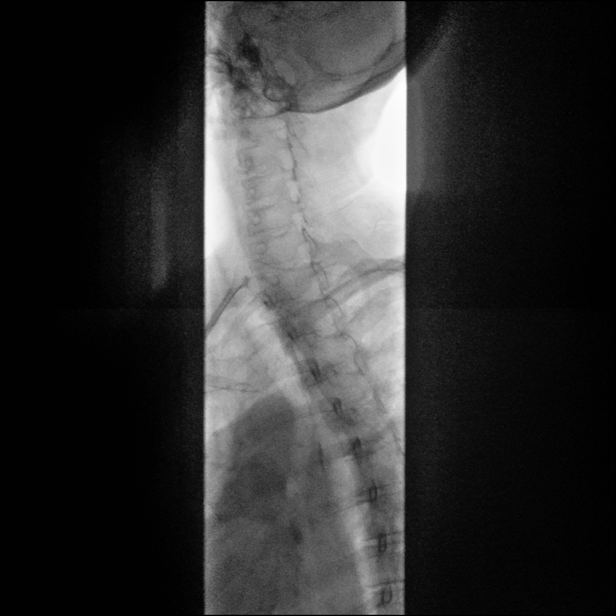
[frame 20/30]
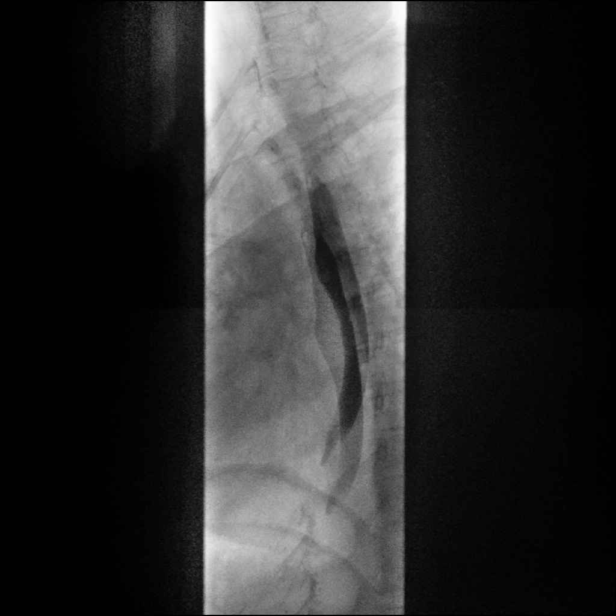

[Series 7: one shot · 0.14mm/px · 2 of 4 slices shown (2 of 2)]
[im 2/4]
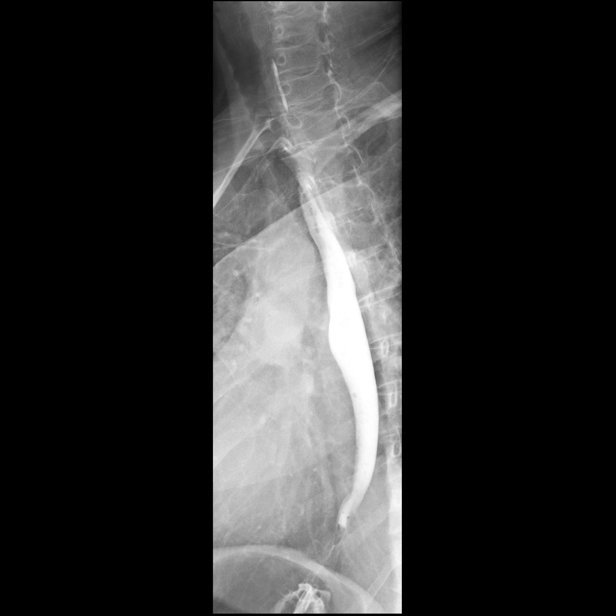
[im 3/4]
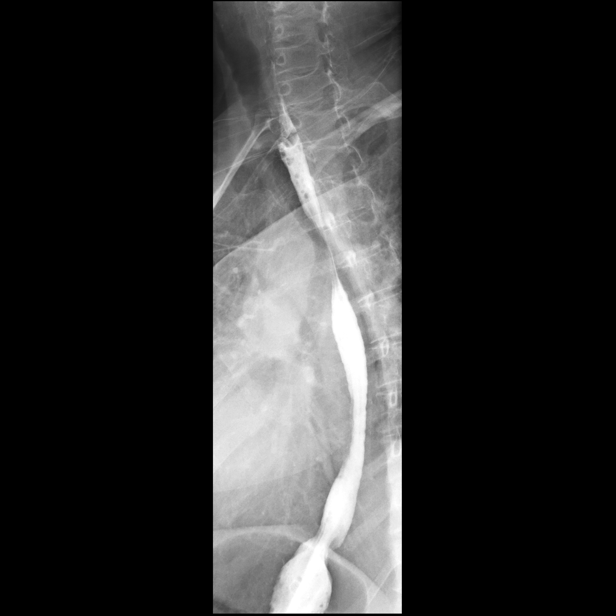

[Series 8: sequence · 2 of 23 frames shown (6 of 6)]
[frame 4/23]
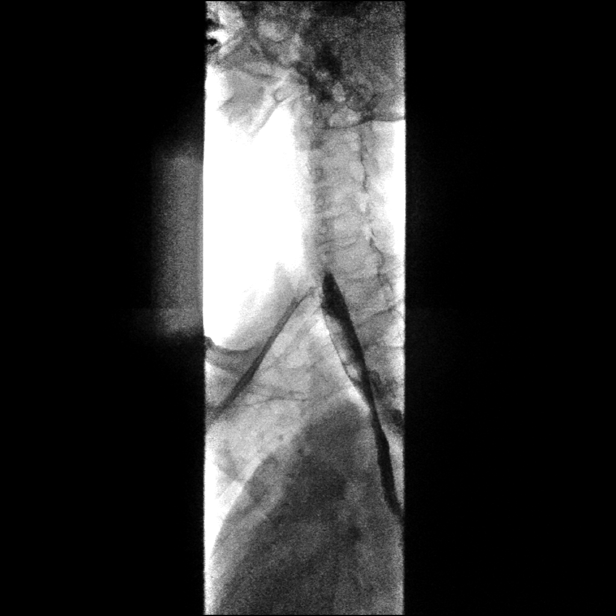
[frame 20/23]
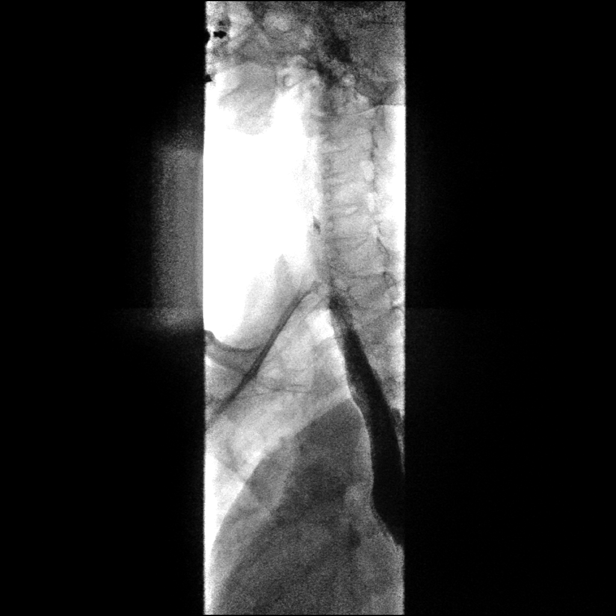

[14 of 24 positions shown; findings below may reference images not displayed]

FINDINGS: The oropharyngeal swallowing mechanisms are normal. The mucosa and
motility of the esophagus are normal. A 13 mm barium tablet passed
from the mouth to the stomach with no significant delay.

There is no esophagitis or stricture. No hiatal hernia demonstrated
with Valsalva maneuver.
IMPRESSION: Normal barium esophagram.

## 2020-01-07 ENCOUNTER — Other Ambulatory Visit: Payer: Self-pay

## 2020-01-07 ENCOUNTER — Ambulatory Visit
Admission: RE | Admit: 2020-01-07 | Discharge: 2020-01-07 | Disposition: A | Discharge status: Home / Self Care | Payer: PRIVATE HEALTH INSURANCE | Source: Ambulatory Visit | Attending: Obstetrics and Gynecology | Admitting: Obstetrics and Gynecology

## 2020-01-07 DIAGNOSIS — Z1231 Encounter for screening mammogram for malignant neoplasm of breast: Secondary | ICD-10-CM

## 2020-01-08 ENCOUNTER — Other Ambulatory Visit: Payer: Self-pay | Admitting: Obstetrics and Gynecology

## 2020-01-08 DIAGNOSIS — R928 Other abnormal and inconclusive findings on diagnostic imaging of breast: Secondary | ICD-10-CM

## 2020-01-14 ENCOUNTER — Ambulatory Visit
Admission: RE | Admit: 2020-01-14 | Discharge: 2020-01-14 | Disposition: A | Discharge status: Home / Self Care | Payer: PRIVATE HEALTH INSURANCE | Source: Ambulatory Visit | Attending: Obstetrics and Gynecology | Admitting: Obstetrics and Gynecology

## 2020-01-14 ENCOUNTER — Other Ambulatory Visit: Payer: Self-pay

## 2020-01-14 ENCOUNTER — Other Ambulatory Visit: Payer: Self-pay | Admitting: Obstetrics and Gynecology

## 2020-01-14 DIAGNOSIS — R928 Other abnormal and inconclusive findings on diagnostic imaging of breast: Secondary | ICD-10-CM

## 2020-01-14 DIAGNOSIS — N6002 Solitary cyst of left breast: Secondary | ICD-10-CM

## 2020-04-29 ENCOUNTER — Other Ambulatory Visit (HOSPITAL_COMMUNITY): Payer: Self-pay | Admitting: *Deleted

## 2020-04-29 ENCOUNTER — Other Ambulatory Visit (HOSPITAL_COMMUNITY): Payer: Self-pay | Admitting: Physician Assistant

## 2020-04-29 ENCOUNTER — Other Ambulatory Visit: Payer: Self-pay | Admitting: Physician Assistant

## 2020-04-29 DIAGNOSIS — R1032 Left lower quadrant pain: Secondary | ICD-10-CM

## 2020-05-07 ENCOUNTER — Encounter (HOSPITAL_COMMUNITY): Payer: Self-pay

## 2020-05-07 ENCOUNTER — Ambulatory Visit (HOSPITAL_COMMUNITY): Payer: PRIVATE HEALTH INSURANCE

## 2020-07-20 ENCOUNTER — Ambulatory Visit
Admission: RE | Admit: 2020-07-20 | Discharge: 2020-07-20 | Disposition: A | Discharge status: Home / Self Care | Payer: PRIVATE HEALTH INSURANCE | Source: Ambulatory Visit | Attending: Obstetrics and Gynecology | Admitting: Obstetrics and Gynecology

## 2020-07-20 ENCOUNTER — Other Ambulatory Visit: Payer: Self-pay

## 2020-07-20 DIAGNOSIS — N6002 Solitary cyst of left breast: Secondary | ICD-10-CM

## 2020-11-09 ENCOUNTER — Other Ambulatory Visit: Payer: Self-pay | Admitting: Gastroenterology

## 2020-11-09 ENCOUNTER — Other Ambulatory Visit (HOSPITAL_COMMUNITY): Payer: Self-pay | Admitting: Gastroenterology

## 2020-11-09 ENCOUNTER — Ambulatory Visit
Admission: RE | Admit: 2020-11-09 | Discharge: 2020-11-09 | Disposition: A | Discharge status: Home / Self Care | Payer: PRIVATE HEALTH INSURANCE | Source: Ambulatory Visit | Attending: Gastroenterology | Admitting: Gastroenterology

## 2020-11-09 DIAGNOSIS — R109 Unspecified abdominal pain: Secondary | ICD-10-CM

## 2020-11-09 DIAGNOSIS — R1012 Left upper quadrant pain: Secondary | ICD-10-CM

## 2020-11-10 ENCOUNTER — Ambulatory Visit (HOSPITAL_BASED_OUTPATIENT_CLINIC_OR_DEPARTMENT_OTHER)
Admission: RE | Admit: 2020-11-10 | Discharge: 2020-11-10 | Disposition: A | Discharge status: Home / Self Care | Payer: PRIVATE HEALTH INSURANCE | Source: Ambulatory Visit | Attending: Gastroenterology | Admitting: Gastroenterology

## 2020-11-10 ENCOUNTER — Other Ambulatory Visit: Payer: Self-pay

## 2020-11-10 DIAGNOSIS — R1012 Left upper quadrant pain: Secondary | ICD-10-CM

## 2021-03-25 ENCOUNTER — Other Ambulatory Visit: Payer: Self-pay | Admitting: Obstetrics and Gynecology

## 2021-03-25 DIAGNOSIS — Z1231 Encounter for screening mammogram for malignant neoplasm of breast: Secondary | ICD-10-CM

## 2021-04-20 ENCOUNTER — Other Ambulatory Visit: Payer: Self-pay

## 2021-04-20 ENCOUNTER — Ambulatory Visit (HOSPITAL_COMMUNITY)
Admission: RE | Admit: 2021-04-20 | Discharge: 2021-04-20 | Disposition: A | Discharge status: Home / Self Care | Payer: No Typology Code available for payment source | Source: Ambulatory Visit | Attending: Urology | Admitting: Urology

## 2021-04-20 ENCOUNTER — Other Ambulatory Visit: Payer: Self-pay | Admitting: Urology

## 2021-04-20 DIAGNOSIS — Z20822 Contact with and (suspected) exposure to covid-19: Secondary | ICD-10-CM | POA: Diagnosis present

## 2021-04-20 DIAGNOSIS — R058 Other specified cough: Secondary | ICD-10-CM | POA: Diagnosis present

## 2021-05-25 ENCOUNTER — Ambulatory Visit
Admission: RE | Admit: 2021-05-25 | Discharge: 2021-05-25 | Disposition: A | Discharge status: Home / Self Care | Payer: No Typology Code available for payment source | Source: Ambulatory Visit | Attending: Obstetrics and Gynecology | Admitting: Obstetrics and Gynecology

## 2021-05-25 ENCOUNTER — Other Ambulatory Visit: Payer: Self-pay | Admitting: Obstetrics and Gynecology

## 2021-05-25 ENCOUNTER — Other Ambulatory Visit: Payer: Self-pay

## 2021-05-25 DIAGNOSIS — Z1231 Encounter for screening mammogram for malignant neoplasm of breast: Secondary | ICD-10-CM

## 2021-08-27 ENCOUNTER — Telehealth: Payer: Self-pay

## 2021-08-27 NOTE — Telephone Encounter (Signed)
-----   Message from Thayer Headings, MD sent at 08/27/2021  7:43 AM EST ----- Regarding: FW: It looks like I have an opening on Monday at 10:40 or 11:40 Can we get Jasmine Green in for a consult ?  Thanks Phil ----- Message ----- From: Irine Seal, MD Sent: 08/27/2021   7:38 AM EST To: Thayer Headings, MD  Elliot Gault had been seen for some PVC's about 20 years ago but had done well in the interim apart from an ER visit in 2013 for some SOB and dizziness.  Over the past week or so she has had some increased PVC's and a little bit of heaviness at night in her chest but no new exertional symptoms.  She has never really tolerated hard cardiovascular exercise.   She had a Cardiac scoring CT in 2018 with a score of 0.  Her most recent cholesterol was a little up.  She isn't seeing [facility] anymore but has moved to a female internist with [facility] whose name escapes me at this time.   She has a family history of CAD.  Her dad had an MI at 80 and her younger brother who is in his mid 19's just had a stent placed.   I know women present differently than men with CAD, but I thought she probably should get seen sometime in the near future with you guys.  Can you give me some guidance.  BTW,  I am overdue to see you and will try to get an appointment set up in the near future.   Thanks,  Jenny Reichmann

## 2021-08-27 NOTE — Telephone Encounter (Signed)
Patient has been scheduled for Monday at 10:40am per Dr. Acie Fredrickson.  Left message for patient to call back.

## 2021-08-27 NOTE — Telephone Encounter (Signed)
Spoke with the patient and advised her of appointment with Dr. Acie Fredrickson on Monday. Patient confirms that she will be able to make the appointment.

## 2021-08-29 ENCOUNTER — Encounter: Payer: Self-pay | Admitting: Cardiovascular Disease

## 2021-08-29 NOTE — Progress Notes (Signed)
Cardiology Office Note:    Date:  08/30/2021   ID:  Jasmine Green, DOB [birth year], MRN [id number]  PCP:  Deland Pretty, MD   [facility] Providers Cardiologist:  Charvi Gammage      Referring MD: Deland Pretty, MD   Chief Complaint  Patient presents with   Chest Pain    Jan. 9, 2023   Jasmine Green is a 61 y.o. female with a hx of  chest pain . I see her husband, Jasmine Green, who asked me to see her for further evaluation of her chest pain / palpitations   Several weeks of PVCs ( was diagnosed with  PVC  many years ago )  Last week, started having left sided chest pain  - associated with some dyspnea Radiatting to left shoulder, left back of her shoulder  Does not occur with exercise She walks on occasion Pain might last for minutes .  Associated with dyspnea  Has occurred multiple times  Rode on her [place] last week,  no discomfort with riding  No pleuretic cp .  Not positional , Does not seem to be muscular soreness  Does upper body work outs on occasion - Pilloties work out . Not associated with any COVID vaccine . No stressors   Not associated with wheezing .  - family hx -  Father had MI in his early 45s,  CABG at 10  Her younger brother had UAP at age 52, has a stent  Older brother - no cad Paternal grandfather - she thinks he had CAD   Also has frequent headaches  - not associated with her chest pain    Past Medical History:  Diagnosis Date   Basal cell carcinoma    face, adomen   IBS (irritable bowel syndrome)     Past Surgical History:  Procedure Laterality Date   ABDOMINAL HYSTERECTOMY     AUGMENTATION MAMMAPLASTY     bilateral retro pectoral saline implants [place]  2006   LAPAROSCOPIC HYSTERECTOMY  2002    Current Medications: Current Meds  Medication Sig   Estradiol 0.75 MG/1.25 GM (0.06%) topical gel Place 1.25 g onto the skin 3 (three) times a week.    magnesium oxide  (MAG-OX) 400 MG tablet Take 400 mg by mouth daily.   metoprolol tartrate (LOPRESSOR) 100 MG tablet Take one tablet (100 mg) by mouth two hours prior to your Cardiac CT.   nitroGLYCERIN (NITROSTAT) 0.4 MG SL tablet Place 1 tablet (0.4 mg total) under the tongue every 5 (five) minutes as needed for chest pain.   Vitamin D, Ergocalciferol, (DRISDOL) 1.25 MG (50000 UNIT) CAPS capsule Take 50,000 Units by mouth once a week.     Allergies:   Codeine   Social History   Socioeconomic History   Marital status: Married    Spouse name: Not on file   Number of children: 3   Years of education: Not on file   Highest education level: Not on file  Occupational History   Occupation: Volunteers  Tobacco Use   Smoking status: Never   Smokeless tobacco: Never  Substance and Sexual Activity   Alcohol use: Yes    Alcohol/week: 2.0 standard drinks    Types: 2 Glasses of wine per week   Drug use: No   Sexual activity: Not on file  Other Topics Concern   Not on file  Social History Narrative   Lives at home with her  husband.   Social Determinants of Health   Financial Resource Strain: Not on file  Food Insecurity: Not on file  Transportation Needs: Not on file  Physical Activity: Not on file  Stress: Not on file  Social Connections: Not on file     Family History: The patient's family history includes Breast cancer in her mother; Coronary artery disease (age of onset: 64) in her father; Hyperlipidemia in her father. There is no history of Colon cancer or Stomach cancer.  ROS:   Please see the history of present illness.     All other systems reviewed and are negative.  EKGs/Labs/Other Studies Reviewed:    The following studies were reviewed today:   EKG:  Jan. 9, 2023:  NSR , no ST or T wave changes.   Recent Labs: 08/30/2021: BUN 14; Creatinine, Ser 0.81; Potassium 4.4; Sodium 136; TSH 1.020  Recent Lipid Panel No results found for: CHOL, TRIG, HDL, CHOLHDL, VLDL, LDLCALC,  LDLDIRECT   Risk Assessment/Calculations:     Physical Exam:    VS:  BP 122/80 (BP Location: Left Arm, Patient Position: Sitting, Cuff Size: Normal)    Pulse 76    Ht 5\' 3"  (1.6 m)    Wt 125 lb 6.4 oz (56.9 kg)    SpO2 96%    BMI 22.21 kg/m     Wt Readings from Last 3 Encounters:  08/30/21 125 lb 6.4 oz (56.9 kg)  03/24/19 124 lb (56.2 kg)  08/13/18 120 lb (54.4 kg)     GEN:  Well nourished, well developed in no acute distress HEENT: Normal NECK: No JVD; No carotid bruits LYMPHATICS: No lymphadenopathy CARDIAC: RR , very soft systolic murmur  RESPIRATORY:  Clear to auscultation without rales, wheezing or rhonchi  ABDOMEN: Soft, non-tender, non-distended MUSCULOSKELETAL:  No edema; No deformity  SKIN: Warm and dry NEUROLOGIC:  Alert and oriented x 3 PSYCHIATRIC:  Normal affect   ASSESSMENT:    1. Unstable angina pectoris ([place])   2. Pre-procedural cardiovascular examination    PLAN:      Chest discomfort: [name] presents today for further evaluation of some chest discomfort.  She has been having the symptoms for a week or 2.  They usually occur at rest but also might occur just after exercise.  Is described as a pressure on the left side of her chest with radiation up to her shoulder.  It last for several minutes.  It is associated with increased shortness of breath.  She is also had an associated with some increased palpitations. She started an aspirin a day several days ago.  Will prescribe nitroglycerin 0.4 mg sublingually as needed.  We will schedule her for coronary CT angiogram.  We will check a TSH and a basic metabolic profile for further evaluation.  Should take the nitroglycerin if she has recurrent episodes of chest discomfort.  I encouraged her to give [place] a call if the if she has episodes of chest pain that are relieved with sublingual nitroglycerin.  2.  Soft systolic murmur: She has a very soft systolic murmur.  It sounds like trivial mitral or tricuspid  regurgitation.  I do not think that she needs an echo at this time.            Medication Adjustments/Labs and Tests Ordered: Current medicines are reviewed at length with the patient today.  Concerns regarding medicines are outlined above.  Orders Placed This Encounter  Procedures   CT CORONARY MORPH W/CTA COR W/SCORE W/CA W/CM &/  OR WO/CM   TSH   Basic Metabolic Panel (BMET)   EKG 12-Lead   Meds ordered this encounter  Medications   nitroGLYCERIN (NITROSTAT) 0.4 MG SL tablet    Sig: Place 1 tablet (0.4 mg total) under the tongue every 5 (five) minutes as needed for chest pain.    Dispense:  25 tablet    Refill:  3   metoprolol tartrate (LOPRESSOR) 100 MG tablet    Sig: Take one tablet (100 mg) by mouth two hours prior to your Cardiac CT.    Dispense:  1 tablet    Refill:  0    Patient Instructions  Medication Instructions:  Nitro 0.4 mg SL as needed for chest discomfort *If you need a refill on your cardiac medications before your next appointment, please call your pharmacy*   Lab Work: TSH, BMET  If you have labs (blood work) drawn today and your tests are completely normal, you will receive your results only by: [place] (if you have MyChart) OR A paper copy in the mail If you have any lab test that is abnormal or we need to change your treatment, we will call you to review the results.   Testing/Procedures:   Your cardiac CT will be scheduled at one of the below locations:   [facility] [address] [place], [place] [address] [phone]  [place] [address] [place], [place] [address] [phone]  If scheduled at [facility], please arrive at the [facility] main entrance (entrance A) of [facility] 30 minutes prior to test start time. You can use the FREE valet parking offered at the main entrance (encouraged to control the heart rate for the  test) Proceed to the [facility] Radiology Department (first floor) to check-in and test prep.  If scheduled at [facility], please arrive 15 mins early for check-in and test prep.  Please follow these instructions carefully (unless otherwise directed):  On the Night Before the Test: Be sure to Drink plenty of water. Do not consume any caffeinated/decaffeinated beverages or chocolate 12 hours prior to your test. Do not take any antihistamines 12 hours prior to your test.   On the Day of the Test: Drink plenty of water until 1 hour prior to the test. Do not eat any food 4 hours prior to the test. You may take your regular medications prior to the test.  Take metoprolol (Lopressor) two hours prior to test. Sent to your Pharmacy.  FEMALES- please wear underwire-free bra if available, avoid dresses & tight clothing       After the Test: Drink plenty of water. After receiving IV contrast, you may experience a mild flushed feeling. This is normal. On occasion, you may experience a mild rash up to 24 hours after the test. This is not dangerous. If this occurs, you can take Benadryl 25 mg and increase your fluid intake. If you experience trouble breathing, this can be serious. If it is severe call 911 IMMEDIATELY. If it is mild, please call our office.  Please allow 2-4 weeks for scheduling of routine cardiac CTs. Some insurance companies require a pre-authorization which may delay scheduling of this test.   For non-scheduling related questions, please contact the cardiac imaging nurse navigator should you have any questions/concerns: Marchia Bond, Cardiac Imaging Nurse Navigator Gordy Clement, Cardiac Imaging Nurse Navigator [place] [facility] Direct Office Dial: [phone]  For scheduling needs, including cancellations and rescheduling, please call [place], [phone].    Follow-Up: At [facility], you and your health needs are  our priority.  As part of our continuing mission to provide you with exceptional heart care, we have created designated Provider Care Teams.  These Care Teams include your primary Cardiologist (physician) and Advanced Practice Providers (APPs -  Physician Assistants and Nurse Practitioners) who all work together to provide you with the care you need, when you need it.  We recommend signing up for the patient portal called "MyChart".  Sign up information is provided on this After Visit Summary.  MyChart is used to connect with patients for Virtual Visits (Telemedicine).  Patients are able to view lab/test results, encounter notes, upcoming appointments, etc.  Non-urgent messages can be sent to your provider as well.   To learn more about what you can do with MyChart, go to [email or web address].    Your next appointment:   4 week(s)  The format for your next appointment:   In Person  Provider:   Dr. Acie Fredrickson   Other Instructions     Signed, Mertie Moores, MD  08/30/2021 6:02 PM    [place]

## 2021-08-30 ENCOUNTER — Ambulatory Visit (INDEPENDENT_AMBULATORY_CARE_PROVIDER_SITE_OTHER): Payer: No Typology Code available for payment source | Admitting: Cardiovascular Disease

## 2021-08-30 ENCOUNTER — Encounter: Payer: Self-pay | Admitting: Cardiovascular Disease

## 2021-08-30 ENCOUNTER — Other Ambulatory Visit: Payer: Self-pay

## 2021-08-30 VITALS — BP 122/80 | HR 76 | Ht 63.0 in | Wt 125.4 lb

## 2021-08-30 DIAGNOSIS — Z01810 Encounter for preprocedural cardiovascular examination: Secondary | ICD-10-CM

## 2021-08-30 DIAGNOSIS — I200 Unstable angina: Secondary | ICD-10-CM

## 2021-08-30 LAB — BASIC METABOLIC PANEL
BUN/Creatinine Ratio: 17 (ref 12–28)
BUN: 14 mg/dL (ref 8–27)
CO2: 22 mmol/L (ref 20–29)
Calcium: 9.2 mg/dL (ref 8.7–10.3)
Chloride: 101 mmol/L (ref 96–106)
Creatinine, Ser: 0.81 mg/dL (ref 0.57–1.00)
Glucose: 101 mg/dL — ABNORMAL HIGH (ref 70–99)
Potassium: 4.4 mmol/L (ref 3.5–5.2)
Sodium: 136 mmol/L (ref 134–144)
eGFR: 83 mL/min/{1.73_m2} (ref >59)

## 2021-08-30 LAB — TSH: TSH: 1.02 u[IU]/mL (ref 0.450–4.500)

## 2021-08-30 MED ORDER — METOPROLOL TARTRATE 100 MG PO TABS: ORAL_TABLET | ORAL | 0 refills | Status: DC

## 2021-08-30 MED ORDER — NITROGLYCERIN 0.4 MG SL SUBL: 0.4000 mg | SUBLINGUAL_TABLET | SUBLINGUAL | 3 refills | Status: DC | PRN

## 2021-08-30 NOTE — Patient Instructions (Addendum)
Medication Instructions:  Nitro 0.4 mg SL as needed for chest discomfort *If you need a refill on your cardiac medications before your next appointment, please call your pharmacy*   Lab Work: TSH, BMET  If you have labs (blood work) drawn today and your tests are completely normal, you will receive your results only by: [place] (if you have MyChart) OR A paper copy in the mail If you have any lab test that is abnormal or we need to change your treatment, we will call you to review the results.   Testing/Procedures:   Your cardiac CT will be scheduled at one of the below locations:   [facility] [address] [place], [place] [address] [phone]  [place] [address] [place], [place] [address] [phone]  If scheduled at [facility], please arrive at the [facility] main entrance (entrance A) of [facility] 30 minutes prior to test start time. You can use the FREE valet parking offered at the main entrance (encouraged to control the heart rate for the test) Proceed to the [facility] Radiology Department (first floor) to check-in and test prep.  If scheduled at [facility], please arrive 15 mins early for check-in and test prep.  Please follow these instructions carefully (unless otherwise directed):  On the Night Before the Test: Be sure to Drink plenty of water. Do not consume any caffeinated/decaffeinated beverages or chocolate 12 hours prior to your test. Do not take any antihistamines 12 hours prior to your test.   On the Day of the Test: Drink plenty of water until 1 hour prior to the test. Do not eat any food 4 hours prior to the test. You may take your regular medications prior to the test.  Take metoprolol (Lopressor) two hours prior to test. Sent to your Pharmacy.  FEMALES- please wear underwire-free bra if available, avoid dresses &  tight clothing       After the Test: Drink plenty of water. After receiving IV contrast, you may experience a mild flushed feeling. This is normal. On occasion, you may experience a mild rash up to 24 hours after the test. This is not dangerous. If this occurs, you can take Benadryl 25 mg and increase your fluid intake. If you experience trouble breathing, this can be serious. If it is severe call 911 IMMEDIATELY. If it is mild, please call our office.  Please allow 2-4 weeks for scheduling of routine cardiac CTs. Some insurance companies require a pre-authorization which may delay scheduling of this test.   For non-scheduling related questions, please contact the cardiac imaging nurse navigator should you have any questions/concerns: Marchia Bond, Cardiac Imaging Nurse Navigator Gordy Clement, Cardiac Imaging Nurse Navigator [place] [facility] Direct Office Dial: [phone]   For scheduling needs, including cancellations and rescheduling, please call [place], [phone].    Follow-Up: At [facility], you and your health needs are our priority.  As part of our continuing mission to provide you with exceptional heart care, we have created designated Provider Care Teams.  These Care Teams include your primary Cardiologist (physician) and Advanced Practice Providers (APPs -  Physician Assistants and Nurse Practitioners) who all work together to provide you with the care you need, when you need it.  We recommend signing up for the patient portal called "MyChart".  Sign up information is provided on this After Visit Summary.  MyChart is used to  connect with patients for Virtual Visits (Telemedicine).  Patients are able to view lab/test results, encounter notes, upcoming appointments, etc.  Non-urgent messages can be sent to your provider as well.   To learn more about what you can do with MyChart, go to [email or web address].    Your next appointment:   4  week(s)  The format for your next appointment:   In Person  Provider:   Dr. Acie Fredrickson   Other Instructions

## 2021-09-08 ENCOUNTER — Telehealth (HOSPITAL_COMMUNITY): Payer: Self-pay | Admitting: *Deleted

## 2021-09-08 NOTE — Telephone Encounter (Signed)
Reaching out to patient to offer assistance regarding upcoming cardiac imaging study; pt verbalizes understanding of appt date/time, parking situation and where to check in, pre-test NPO status and medications ordered, and verified current allergies; name and call back number provided for further questions should they arise  Gordy Clement RN Navigator Cardiac Imaging [facility] [phone] office [phone] cell  Patient to take 100mg  metoprolol tartrate two hours prior to cardiac CT scan. She is aware to arrive at 9am for her 9:30am scan.

## 2021-09-09 ENCOUNTER — Other Ambulatory Visit: Payer: Self-pay

## 2021-09-09 ENCOUNTER — Ambulatory Visit (HOSPITAL_COMMUNITY)
Admission: RE | Admit: 2021-09-09 | Discharge: 2021-09-09 | Disposition: A | Discharge status: Home / Self Care | Payer: No Typology Code available for payment source | Source: Ambulatory Visit | Attending: Cardiovascular Disease | Admitting: Cardiovascular Disease

## 2021-09-09 DIAGNOSIS — I200 Unstable angina: Secondary | ICD-10-CM | POA: Insufficient documentation

## 2021-09-09 MED ORDER — IOHEXOL 350 MG/ML SOLN
95.0000 mL | Freq: Once | INTRAVENOUS | Status: AC | PRN
  Administered 2021-09-09: 95 mL via INTRAVENOUS

## 2021-09-09 MED ORDER — NITROGLYCERIN 0.4 MG SL SUBL
SUBLINGUAL_TABLET | SUBLINGUAL | Status: AC
  Filled 2021-09-09: qty 2

## 2021-09-09 MED ORDER — NITROGLYCERIN 0.4 MG SL SUBL
0.8000 mg | SUBLINGUAL_TABLET | Freq: Once | SUBLINGUAL | Status: AC
  Administered 2021-09-09: 0.8 mg via SUBLINGUAL

## 2021-09-10 ENCOUNTER — Telehealth: Payer: Self-pay | Admitting: Cardiovascular Disease

## 2021-09-10 NOTE — Telephone Encounter (Signed)
Pt returning a phone call.. please advise

## 2021-09-10 NOTE — Telephone Encounter (Signed)
Called pt reviewed results please see Cardiac CT results.

## 2021-09-23 ENCOUNTER — Encounter: Payer: Self-pay | Admitting: Cardiovascular Disease

## 2021-09-23 ENCOUNTER — Other Ambulatory Visit: Payer: Self-pay

## 2021-09-23 ENCOUNTER — Ambulatory Visit (INDEPENDENT_AMBULATORY_CARE_PROVIDER_SITE_OTHER): Payer: No Typology Code available for payment source | Admitting: Cardiovascular Disease

## 2021-09-23 VITALS — BP 122/78 | HR 75 | Ht 63.0 in | Wt 128.4 lb

## 2021-09-23 DIAGNOSIS — E782 Mixed hyperlipidemia: Secondary | ICD-10-CM

## 2021-09-23 DIAGNOSIS — I2510 Atherosclerotic heart disease of native coronary artery without angina pectoris: Secondary | ICD-10-CM | POA: Diagnosis not present

## 2021-09-23 DIAGNOSIS — I2584 Coronary atherosclerosis due to calcified coronary lesion: Secondary | ICD-10-CM

## 2021-09-23 DIAGNOSIS — I200 Unstable angina: Secondary | ICD-10-CM | POA: Diagnosis not present

## 2021-09-23 DIAGNOSIS — R002 Palpitations: Secondary | ICD-10-CM | POA: Diagnosis not present

## 2021-09-23 MED ORDER — ROSUVASTATIN CALCIUM 10 MG PO TABS: 10.0000 mg | ORAL_TABLET | Freq: Every day | ORAL | 3 refills | Status: DC

## 2021-09-23 NOTE — Progress Notes (Signed)
Cardiology Office Note:    Date:  2/2/Green   ID:  Jasmine Green, DOB 10-Sep-1960, MRN [id number]  PCP:  Charlane Ferretti, MD   [facility] Providers Cardiologist:  Curry Dulski      Referring MD: Deland Pretty, MD   Chief Complaint  Patient presents with   Chest Pain    Jasmine Green   [name] is a 61 y.o. female with a hx of  chest pain . I see her husband, Jenny Reichmann, who asked me to see her for further evaluation of her chest pain / palpitations   Several weeks of PVCs ( was diagnosed with  PVC  many years ago )  Last week, started having left sided chest pain  - associated with some dyspnea Radiatting to left shoulder, left back of her shoulder  Does not occur with exercise She walks on occasion Pain might last for minutes .  Associated with dyspnea  Has occurred multiple times  Rode on her [place] last week,  no discomfort with riding  No pleuretic cp .  Not positional , Does not seem to be muscular soreness  Does upper body work outs on occasion - Pilloties work out . Not associated with any COVID vaccine . No stressors   Not associated with wheezing .  - family hx -  Father had MI in his early 75s,  CABG at 33  Her younger brother had UAP at age 30, has a stent  Older brother - no cad Paternal grandfather - she thinks he had CAD   Also has frequent headaches  - not associated with her chest pain    Feb. 2, Green Jasmine Green is seen back today for follow up of an episode of CP and palpitations Cor. CT angio revealed  Coronary calcium score: The patient's coronary artery calcium score is 2, which places the patient in the 63rd percentile. Her coronary arteries were normal  She continues to have heart fluttering .  These palpitations are daily , perhaps QOD , Does not drink much caffeine    The chest pain is more like L shoulder pain .   Is not related to movement    Past Medical History:  Diagnosis Date   Basal cell carcinoma    face, adomen   IBS  (irritable bowel syndrome)     Past Surgical History:  Procedure Laterality Date   ABDOMINAL HYSTERECTOMY     AUGMENTATION MAMMAPLASTY     bilateral retro pectoral saline implants [place]  2006   LAPAROSCOPIC HYSTERECTOMY  2002    Current Medications: Current Meds  Medication Sig   Estradiol 0.75 MG/1.25 GM (0.06%) topical gel Place 1.25 g onto the skin 3 (three) times a week.    magnesium oxide (MAG-OX) 400 MG tablet Take 400 mg by mouth daily.   nitroGLYCERIN (NITROSTAT) 0.4 MG SL tablet Place 1 tablet (0.4 mg total) under the tongue every 5 (five) minutes as needed for chest pain.   rosuvastatin (CRESTOR) 10 MG tablet Take 1 tablet (10 mg total) by mouth daily.   Vitamin D, Ergocalciferol, (DRISDOL) 1.25 MG (50000 UNIT) CAPS capsule Take 50,000 Units by mouth once a week.     Allergies:   Codeine   Social History   Socioeconomic History   Marital status: Married    Spouse name: Not on file   Number of children: 3   Years of education: Not on file  Highest education level: Not on file  Occupational History   Occupation: Volunteers  Tobacco Use   Smoking status: Never   Smokeless tobacco: Never  Substance and Sexual Activity   Alcohol use: Yes    Alcohol/week: 2.0 standard drinks    Types: 2 Glasses of wine per week   Drug use: No   Sexual activity: Not on file  Other Topics Concern   Not on file  Social History Narrative   Lives at home with her husband.   Social Determinants of Health   Financial Resource Strain: Not on file  Food Insecurity: Not on file  Transportation Needs: Not on file  Physical Activity: Not on file  Stress: Not on file  Social Connections: Not on file     Family History: The patient's family history includes Breast cancer in her mother; Coronary artery disease (age of onset: 77) in her father; Hyperlipidemia in her father. There is no history of Colon cancer or  Stomach cancer.  ROS:   Please see the history of present illness.     All other systems reviewed and are negative.  EKGs/Labs/Other Studies Reviewed:    The following studies were reviewed today:   EKG:     Recent Labs: 1/9/Green: BUN 14; Creatinine, Ser 0.81; Potassium 4.4; Sodium 136; TSH 1.020  Recent Lipid Panel No results found for: CHOL, TRIG, HDL, CHOLHDL, VLDL, LDLCALC, LDLDIRECT   Risk Assessment/Calculations:     Physical Exam:     Physical Exam: Blood pressure 122/78, pulse 75, height 5\' 3"  (1.6 m), weight 128 lb 6.4 oz (58.2 kg), SpO2 98 %.  GEN:  Well nourished, well developed in no acute distress HEENT: Normal NECK: No JVD; No carotid bruits LYMPHATICS: No lymphadenopathy CARDIAC: RRR , soft systolic murmur at LSB  RESPIRATORY:  Clear to auscultation without rales, wheezing or rhonchi  ABDOMEN: Soft, non-tender, non-distended MUSCULOSKELETAL:  No edema; No deformity  SKIN: Warm and dry NEUROLOGIC:  Alert and oriented x 3   ASSESSMENT:    1. Unstable angina pectoris ([place])   2. Mixed hyperlipidemia   3. Palpitations   4. Coronary artery calcification     PLAN:      Chest discomfort: Coronary CT angiogram revealed no evidence of coronary artery disease.  She does have a coronary calcium score of 2.  Her last LDL is 130.  Given her family history of coronary artery disease and the LDL of 120 I think that we should aim for an LDL of around 70.  We will start her on rosuvastatin 10 mg a day.  We will check lipids, ALT, basic metabolic profile in 3 months.  2.  Soft systolic murmur: I suspect that she has trace mitral vegetation or tricuspid regurgitation.  I do not think this needs any further work-up at this time.  3.  Palpitations: She is having palpitations that may be premature ventricular contractions.  She did not want to wear a monitor.  I gave her instructions on how to get an alive core Kardia monitor.    Medication Adjustments/Labs and  Tests Ordered: Current medicines are reviewed at length with the patient today.  Concerns regarding medicines are outlined above.  Orders Placed This Encounter  Procedures   Lipid panel   Basic Metabolic Panel (BMET)   ALT   Meds ordered this encounter  Medications   rosuvastatin (CRESTOR) 10 MG tablet    Sig: Take 1 tablet (10 mg total) by mouth daily.    Dispense:  90 tablet    Refill:  3     Patient Instructions  Medication Instructions:  Your physician has recommended you make the following change in your medication:   START Rosuvastatin 10 mg taking 1 daily    *If you need a refill on your cardiac medications before your next appointment, please call your pharmacy*   Lab Work: 12/21/21:  COME IN THE OFFICE, ANYWHERE FROM 7:15 A.M-5:00 PM. (FASTING) FOR:  LIPID, BMET, & ALT  If you have labs (blood work) drawn today and your tests are completely normal, you will receive your results only by: [place] (if you have MyChart) OR A paper copy in the mail If you have any lab test that is abnormal or we need to change your treatment, we will call you to review the results.   Testing/Procedures: None ordered   Follow-Up: At [facility], you and your health needs are our priority.  As part of our continuing mission to provide you with exceptional heart care, we have created designated Provider Care Teams.  These Care Teams include your primary Cardiologist (physician) and Advanced Practice Providers (APPs -  Physician Assistants and Nurse Practitioners) who all work together to provide you with the care you need, when you need it.  We recommend signing up for the patient portal called "MyChart".  Sign up information is provided on this After Visit Summary.  MyChart is used to connect with patients for Virtual Visits (Telemedicine).  Patients are able to view lab/test results, encounter notes, upcoming appointments, etc.  Non-urgent messages can be sent to your provider as  well.   To learn more about what you can do with MyChart, go to [email or web address].    Your next appointment:    6 month(s)  The format for your next appointment:   In Person  Provider:   Liam Rogers, MD,   Other Instructions  Kardia monitor By [place] to your phone    Signed, Mertie Moores, MD  2/2/Green 3:37 PM    [place]

## 2021-09-23 NOTE — Patient Instructions (Addendum)
Medication Instructions:  Your physician has recommended you make the following change in your medication:   START Rosuvastatin 10 mg taking 1 daily    *If you need a refill on your cardiac medications before your next appointment, please call your pharmacy*   Lab Work: 12/21/21:  COME IN THE OFFICE, ANYWHERE FROM 7:15 A.M-5:00 PM. (FASTING) FOR:  LIPID, BMET, & ALT  If you have labs (blood work) drawn today and your tests are completely normal, you will receive your results only by: [place] (if you have MyChart) OR A paper copy in the mail If you have any lab test that is abnormal or we need to change your treatment, we will call you to review the results.   Testing/Procedures: None ordered   Follow-Up: At [facility], you and your health needs are our priority.  As part of our continuing mission to provide you with exceptional heart care, we have created designated Provider Care Teams.  These Care Teams include your primary Cardiologist (physician) and Advanced Practice Providers (APPs -  Physician Assistants and Nurse Practitioners) who all work together to provide you with the care you need, when you need it.  We recommend signing up for the patient portal called "MyChart".  Sign up information is provided on this After Visit Summary.  MyChart is used to connect with patients for Virtual Visits (Telemedicine).  Patients are able to view lab/test results, encounter notes, upcoming appointments, etc.  Non-urgent messages can be sent to your provider as well.   To learn more about what you can do with MyChart, go to [email or web address].    Your next appointment:    6 month(s)  The format for your next appointment:   In Person  Provider:   Liam Rogers, MD,   Other Instructions  Kardia monitor By Clare to your phone

## 2021-10-04 ENCOUNTER — Encounter: Payer: Self-pay | Admitting: Cardiovascular Disease

## 2021-12-21 ENCOUNTER — Other Ambulatory Visit: Payer: No Typology Code available for payment source | Admitting: *Deleted

## 2021-12-21 DIAGNOSIS — R002 Palpitations: Secondary | ICD-10-CM

## 2021-12-21 DIAGNOSIS — E782 Mixed hyperlipidemia: Secondary | ICD-10-CM

## 2021-12-21 DIAGNOSIS — I2510 Atherosclerotic heart disease of native coronary artery without angina pectoris: Secondary | ICD-10-CM

## 2021-12-21 DIAGNOSIS — I200 Unstable angina: Secondary | ICD-10-CM

## 2021-12-21 LAB — BASIC METABOLIC PANEL
BUN/Creatinine Ratio: 17 (ref 12–28)
BUN: 14 mg/dL (ref 8–27)
CO2: 25 mmol/L (ref 20–29)
Calcium: 9.1 mg/dL (ref 8.7–10.3)
Chloride: 100 mmol/L (ref 96–106)
Creatinine, Ser: 0.83 mg/dL (ref 0.57–1.00)
Glucose: 98 mg/dL (ref 70–99)
Potassium: 4.1 mmol/L (ref 3.5–5.2)
Sodium: 137 mmol/L (ref 134–144)
eGFR: 80 mL/min/{1.73_m2} (ref >59)

## 2021-12-21 LAB — LIPID PANEL
Chol/HDL Ratio: 2.8 ratio (ref 0.0–4.4)
Cholesterol, Total: 201 mg/dL — ABNORMAL HIGH (ref 100–199)
HDL: 73 mg/dL (ref >39)
LDL Chol Calc (NIH): 112 mg/dL — ABNORMAL HIGH (ref 0–99)
Triglycerides: 92 mg/dL (ref 0–149)
VLDL Cholesterol Cal: 16 mg/dL (ref 5–40)

## 2021-12-21 LAB — ALT: ALT: 22 IU/L (ref 0–32)

## 2021-12-23 ENCOUNTER — Encounter: Payer: Self-pay | Admitting: Cardiovascular Disease

## 2021-12-23 DIAGNOSIS — Z79899 Other long term (current) drug therapy: Secondary | ICD-10-CM

## 2021-12-24 NOTE — Telephone Encounter (Signed)
-----   Message from Thayer Headings, MD sent at 12/23/2021  9:26 AM EDT ----- ?CAC score of 2 ?Her LDL goal is < 70. ?Add Zetia 10 mg a day  ?Recheck lipids and ALT in 2-3 months  ?I would have a low threshold to refer her to the lipid clinic for consideration of PCSK9 inhibitor ?

## 2021-12-24 NOTE — Telephone Encounter (Signed)
Called and spoke to patient who states that she has not started taking Rosuvastatin '10mg'$  yet d/t considering starting prolia for her bone density and was uneasy about starting multiple medications at once. Pt states that she still has not begun Prolia and likely will not be, so she is willing to begin the Rosuvastatin now. (Currently out of town and will be back at home to begin the medication after mother's day on 5/14). She states she will start this now and come back in for labs in 3 months. This appt has been schedule for 04/11/22. Will hold off on adding the Zetia and see what her labs show. ?

## 2022-03-29 ENCOUNTER — Other Ambulatory Visit: Payer: Self-pay | Admitting: Gastroenterology

## 2022-03-29 DIAGNOSIS — R935 Abnormal findings on diagnostic imaging of other abdominal regions, including retroperitoneum: Secondary | ICD-10-CM

## 2022-04-11 ENCOUNTER — Other Ambulatory Visit: Payer: No Typology Code available for payment source

## 2022-04-11 ENCOUNTER — Other Ambulatory Visit: Payer: Self-pay | Admitting: Obstetrics and Gynecology

## 2022-04-11 DIAGNOSIS — Z1231 Encounter for screening mammogram for malignant neoplasm of breast: Secondary | ICD-10-CM

## 2022-04-11 DIAGNOSIS — Z79899 Other long term (current) drug therapy: Secondary | ICD-10-CM

## 2022-04-11 LAB — LIPID PANEL
Chol/HDL Ratio: 3.1 ratio (ref 0.0–4.4)
Cholesterol, Total: 221 mg/dL — ABNORMAL HIGH (ref 100–199)
HDL: 72 mg/dL (ref >39)
LDL Chol Calc (NIH): 127 mg/dL — ABNORMAL HIGH (ref 0–99)
Triglycerides: 126 mg/dL (ref 0–149)
VLDL Cholesterol Cal: 22 mg/dL (ref 5–40)

## 2022-04-11 LAB — ALT: ALT: 19 IU/L (ref 0–32)

## 2022-04-12 ENCOUNTER — Encounter: Payer: Self-pay | Admitting: Cardiovascular Disease

## 2022-04-12 NOTE — Progress Notes (Unsigned)
Cardiology Office Note:    Date:  04/13/2022   ID:  SHYNA DUIGNAN, DOB 03/20/1961, MRN [id number]  PCP:  Charlane Ferretti, MD   [facility] Providers Cardiologist:  Rochester Serpe      Referring MD: Charlane Ferretti, MD   Chief Complaint  Patient presents with   Chest Pain    Jan. 9, 2023   MILINA PAGETT is a 61 y.o. female with a hx of  chest pain . I see her husband, Jenny Reichmann, who asked me to see her for further evaluation of her chest pain / palpitations   Several weeks of PVCs ( was diagnosed with  PVC  many years ago )  Last week, started having left sided chest pain  - associated with some dyspnea Radiatting to left shoulder, left back of her shoulder  Does not occur with exercise She walks on occasion Pain might last for minutes .  Associated with dyspnea  Has occurred multiple times  Rode on her [place] last week,  no discomfort with riding  No pleuretic cp .  Not positional , Does not seem to be muscular soreness  Does upper body work outs on occasion - Pilloties work out . Not associated with any COVID vaccine . No stressors   Not associated with wheezing .  - family hx -  Father had MI in his early 75s,  CABG at 80  Her younger brother had UAP at age 46, has a stent  Older brother - no cad Paternal grandfather - she thinks he had CAD   Also has frequent headaches  - not associated with her chest pain    Feb. 2, 2023 [name] is seen back today for follow up of an episode of CP and palpitations Cor. CT angio revealed  Coronary calcium score: The patient's coronary artery calcium score is 2, which places the patient in the 63rd percentile. Her coronary arteries were normal  She continues to have heart fluttering .  These palpitations are daily , perhaps QOD , Does not drink much caffeine    The chest pain is more like L shoulder pain .   Is not related to movement    Aug. 23, 2023 [name] is seen for follow up of her chest pain , palpitations  Coronary  CTA revealed a CAC of 2 Normal coronaries   Lipid panel form 04/11/22 Chol = 221 HDL = 72 Trigs = 126 LDL = 127  Started Rosuvastatin but  may have had some muscle aches . Decided not to take it   Younger brother age 61 had CAD ( does not take care of himself as well)   Palpitations:    Past Medical History:  Diagnosis Date   Basal cell carcinoma    face, adomen   IBS (irritable bowel syndrome)     Past Surgical History:  Procedure Laterality Date   ABDOMINAL HYSTERECTOMY     AUGMENTATION MAMMAPLASTY     bilateral retro pectoral saline implants [place]  2006   LAPAROSCOPIC HYSTERECTOMY  2002    Current Medications: Current Meds  Medication Sig   cholecalciferol (VITAMIN D3) 25 MCG (1000 UNIT) tablet Take 1,000 Units by mouth daily.   Estradiol 0.75 MG/1.25 GM (0.06%) topical gel Place 1.25 g onto the skin 3 (three) times a week.    magnesium oxide (MAG-OX) 400 MG tablet Take 400 mg by mouth daily.   nitroGLYCERIN (NITROSTAT) 0.4  MG SL tablet Place 1 tablet (0.4 mg total) under the tongue every 5 (five) minutes as needed for chest pain.     Allergies:   Codeine   Social History   Socioeconomic History   Marital status: Married    Spouse name: Not on file   Number of children: 3   Years of education: Not on file   Highest education level: Not on file  Occupational History   Occupation: Volunteers  Tobacco Use   Smoking status: Never   Smokeless tobacco: Never  Substance and Sexual Activity   Alcohol use: Yes    Alcohol/week: 2.0 standard drinks of alcohol    Types: 2 Glasses of wine per week   Drug use: No   Sexual activity: Not on file  Other Topics Concern   Not on file  Social History Narrative   Lives at home with her husband.   Social Determinants of Health   Financial Resource Strain: Not on file  Food Insecurity: Not on file  Transportation Needs: Not on file  Physical  Activity: Not on file  Stress: Not on file  Social Connections: Not on file     Family History: The patient's family history includes Breast cancer in her mother; Coronary artery disease (age of onset: 63) in her father; Hyperlipidemia in her father. There is no history of Colon cancer or Stomach cancer.  ROS:   Please see the history of present illness.     All other systems reviewed and are negative.  EKGs/Labs/Other Studies Reviewed:    The following studies were reviewed today:   EKG:     Recent Labs: 08/30/2021: TSH 1.020 12/21/2021: BUN 14; Creatinine, Ser 0.83; Potassium 4.1; Sodium 137 04/11/2022: ALT 19  Recent Lipid Panel    Component Value Date/Time   CHOL 221 (H) 04/11/2022 0747   TRIG 126 04/11/2022 0747   HDL 72 04/11/2022 0747   CHOLHDL 3.1 04/11/2022 0747   LDLCALC 127 (H) 04/11/2022 0747     Risk Assessment/Calculations:     Physical Exam:     Physical Exam: Blood pressure 110/68, pulse 71, height '5\' 3"'$  (1.6 m), weight 123 lb 3.2 oz (55.9 kg), SpO2 99 %.       GEN:  Well nourished, well developed in no acute distress HEENT: Normal NECK: No JVD; No carotid bruits LYMPHATICS: No lymphadenopathy CARDIAC: RRR , no murmurs, rubs, gallops RESPIRATORY:  Clear to auscultation without rales, wheezing or rhonchi  ABDOMEN: Soft, non-tender, non-distended MUSCULOSKELETAL:  No edema; No deformity  SKIN: Warm and dry NEUROLOGIC:  Alert and oriented x 3   ASSESSMENT:    1. Mixed hyperlipidemia      PLAN:      Chest discomfort: Coronary CT angiogram revealed no evidence of coronary artery disease.  She does have a coronary calcium score of 2.  Her last LDL is 130.  At this point she is not inclined to start/restart any medications.  Her coronary calcium score is very low at 2.  The coronary CT angiogram revealed normal coronary arteries.  She appears to be very low risk.  We will continue to discuss the situation.  I will plan on seeing her again  in 1 year.   .  2.  Soft systolic murmur:  benign   3.  Palpitations:   No recent palpitation s   Medication Adjustments/Labs and Tests Ordered: Current medicines are reviewed at length with the patient today.  Concerns regarding medicines are outlined above.  No  orders of the defined types were placed in this encounter.  No orders of the defined types were placed in this encounter.    Patient Instructions  Medication Instructions:  Your physician recommends that you continue on your current medications as directed. Please refer to the Current Medication list given to you today.  *If you need a refill on your cardiac medications before your next appointment, please call your pharmacy*   Follow-Up: At [facility], you and your health needs are our priority.  As part of our continuing mission to provide you with exceptional heart care, we have created designated Provider Care Teams.  These Care Teams include your primary Cardiologist (physician) and Advanced Practice Providers (APPs -  Physician Assistants and Nurse Practitioners) who all work together to provide you with the care you need, when you need it.    Your next appointment:   1 year(s)  The format for your next appointment:   In Person  Provider:   Mertie Moores, MD {           Signed, Mertie Moores, MD  04/13/2022 7:22 PM    [place]

## 2022-04-13 ENCOUNTER — Ambulatory Visit (INDEPENDENT_AMBULATORY_CARE_PROVIDER_SITE_OTHER): Payer: No Typology Code available for payment source | Admitting: Cardiovascular Disease

## 2022-04-13 ENCOUNTER — Encounter: Payer: Self-pay | Admitting: Cardiovascular Disease

## 2022-04-13 DIAGNOSIS — E785 Hyperlipidemia, unspecified: Secondary | ICD-10-CM | POA: Insufficient documentation

## 2022-04-13 DIAGNOSIS — E782 Mixed hyperlipidemia: Secondary | ICD-10-CM

## 2022-04-13 NOTE — Patient Instructions (Signed)
Medication Instructions:  Your physician recommends that you continue on your current medications as directed. Please refer to the Current Medication list given to you today.  *If you need a refill on your cardiac medications before your next appointment, please call your pharmacy*   Follow-Up: At [facility], you and your health needs are our priority.  As part of our continuing mission to provide you with exceptional heart care, we have created designated Provider Care Teams.  These Care Teams include your primary Cardiologist (physician) and Advanced Practice Providers (APPs -  Physician Assistants and Nurse Practitioners) who all work together to provide you with the care you need, when you need it.  Your next appointment:   1 year(s)  The format for your next appointment:   In Person  Provider:   Philip Nahser, MD            

## 2022-04-14 ENCOUNTER — Ambulatory Visit
Admission: RE | Admit: 2022-04-14 | Discharge: 2022-04-14 | Disposition: A | Discharge status: Home / Self Care | Payer: No Typology Code available for payment source | Source: Ambulatory Visit | Attending: Gastroenterology | Admitting: Gastroenterology

## 2022-04-14 DIAGNOSIS — R935 Abnormal findings on diagnostic imaging of other abdominal regions, including retroperitoneum: Secondary | ICD-10-CM

## 2022-04-18 ENCOUNTER — Other Ambulatory Visit: Payer: Self-pay | Admitting: Gastroenterology

## 2022-04-18 DIAGNOSIS — R160 Hepatomegaly, not elsewhere classified: Secondary | ICD-10-CM

## 2022-04-18 DIAGNOSIS — R935 Abnormal findings on diagnostic imaging of other abdominal regions, including retroperitoneum: Secondary | ICD-10-CM

## 2022-05-09 ENCOUNTER — Other Ambulatory Visit: Payer: No Typology Code available for payment source

## 2022-06-13 ENCOUNTER — Ambulatory Visit
Admission: RE | Admit: 2022-06-13 | Discharge: 2022-06-13 | Disposition: A | Discharge status: Home / Self Care | Payer: No Typology Code available for payment source | Source: Ambulatory Visit | Attending: Obstetrics and Gynecology | Admitting: Obstetrics and Gynecology

## 2022-06-13 DIAGNOSIS — Z1231 Encounter for screening mammogram for malignant neoplasm of breast: Secondary | ICD-10-CM

## 2022-06-14 ENCOUNTER — Ambulatory Visit
Admission: RE | Admit: 2022-06-14 | Discharge: 2022-06-14 | Disposition: A | Discharge status: Home / Self Care | Payer: No Typology Code available for payment source | Source: Ambulatory Visit | Attending: Gastroenterology | Admitting: Gastroenterology

## 2022-06-14 DIAGNOSIS — R935 Abnormal findings on diagnostic imaging of other abdominal regions, including retroperitoneum: Secondary | ICD-10-CM

## 2022-06-14 DIAGNOSIS — R160 Hepatomegaly, not elsewhere classified: Secondary | ICD-10-CM

## 2022-06-14 MED ORDER — GADOPICLENOL 0.5 MMOL/ML IV SOLN
5.5000 mL | Freq: Once | INTRAVENOUS | Status: AC | PRN
  Administered 2022-06-14: 5.5 mL via INTRAVENOUS

## 2022-10-13 ENCOUNTER — Other Ambulatory Visit (HOSPITAL_COMMUNITY): Payer: Self-pay | Admitting: Urology

## 2022-10-13 ENCOUNTER — Ambulatory Visit (HOSPITAL_COMMUNITY)
Admission: RE | Admit: 2022-10-13 | Discharge: 2022-10-13 | Disposition: A | Discharge status: Home / Self Care | Payer: No Typology Code available for payment source | Source: Ambulatory Visit | Attending: Urology | Admitting: Urology

## 2022-10-13 DIAGNOSIS — R052 Subacute cough: Secondary | ICD-10-CM | POA: Diagnosis present

## 2023-04-10 ENCOUNTER — Other Ambulatory Visit: Payer: Self-pay | Admitting: Urology

## 2023-04-10 ENCOUNTER — Ambulatory Visit
Admission: RE | Admit: 2023-04-10 | Discharge: 2023-04-10 | Disposition: A | Discharge status: Home / Self Care | Payer: No Typology Code available for payment source | Source: Ambulatory Visit | Attending: Urology | Admitting: Urology

## 2023-04-10 DIAGNOSIS — M546 Pain in thoracic spine: Secondary | ICD-10-CM

## 2023-07-28 ENCOUNTER — Telehealth: Payer: Self-pay

## 2023-07-28 DIAGNOSIS — E782 Mixed hyperlipidemia: Secondary | ICD-10-CM

## 2023-07-28 DIAGNOSIS — I2510 Atherosclerotic heart disease of native coronary artery without angina pectoris: Secondary | ICD-10-CM

## 2023-07-28 NOTE — Telephone Encounter (Signed)
Referral placed to Dr Rennis Golden lipid clinic at this time

## 2023-07-28 NOTE — Telephone Encounter (Signed)
----- Message from Kristeen Miss sent at 07/28/2023  8:34 AM EST ----- Regarding: RE: atherosclerosis  Maralyn Sago,  will you place a referral for Lynden Ang to see Dr. Rennis Golden in the lipid clinic for hyperlipidemia    I'm retiring on June 27.   I've not picked my replacement yet I'm open to suggestions if you have a preference  [place] would be a reasonable choice since her main issue is hyperlipidemia  We also have a prevention clinic at our [place] location with is associated with the lipid clinic My former nurse Eligha Bridegroom, NP) is now a NP in the lipid clinic and she could see Lynden Ang as well. ----- Message ----- From: Bjorn Pippin, MD Sent: 07/28/2023   8:32 AM EST To: Vesta Mixer, MD Subject: RE: atherosclerosis                            If you can get her set up with [place] that would be great.    Is your retirement date 12/31?  Lynden Ang was curious as to who she should see going forward for general cardiology.    Thanks ----- Message ----- From: Vesta Mixer, MD Sent: 07/28/2023   8:28 AM EST To: Bjorn Pippin, MD Subject: RE: atherosclerosis                            Hi John,  Its an interesting question. The stronger statins ( Atorvastatin , Rosuvastatin) have the best data on reducing CV risk.  The weaker statins do not have very strong data.  We don't typically see clinical osteoporosis from statin therapy.  I think the best option would be for low dose Rosuvastatin ( ie 5 mg a day or several times a week)  We could consider a non statin option such as  Bempedoic Acid or zetia.  Both of these have indications for primary prevention but they are not as strong as Rosuvastatin or Atorvastatin .  I discuss this with [place] Hilty ( our Lipid specialist) and he agrees with these thoughts.   I would be happy to have her see [place]  for a second opinion if you would like    Phil ----- Message ----- From: Bjorn Pippin, MD Sent: 07/27/2023   9:00 AM EST To: Vesta Mixer,  MD Subject: atherosclerosis                                Phil,     I wanted to get your advice for [facility].   She had a CT stone study earlier this week to assess for a stone.  I looked at the films and noted that since a prior study in 2021, she has had a notable increase in calcified plaques in the aorta and iliacs.  It's not severe but definitely has progressed.     She has been found to have an elevated cholesterol in the past and was prescribed rosuvastatin but I don't think she ever took it.  She is not a big fan of meds.  Her coronary calcium score in the past was low as well.   I suggested that she might want to reconsider the statin and she asked what impact it might have on bone health since she has osteoporosis and didn't have the best experience with an initial dose of Prolia.  I did some investigation and what I could find suggested that high dose statins can adversely impact bone density whereas there is some suggestion that low dose statins, particular lipophilic statins such as simvastatin can potentially increase bone density.   Would you be able to look at the CTs and let me know your thoughts?   They  were done in my office but can be accessed by opening the PAC's viewed from any film in EPIC.  Thanks  John.

## 2024-01-02 ENCOUNTER — Ambulatory Visit (INDEPENDENT_AMBULATORY_CARE_PROVIDER_SITE_OTHER): Payer: No Typology Code available for payment source | Admitting: Internal Medicine

## 2024-01-02 VITALS — BP 128/74 | HR 61 | Ht 63.0 in | Wt 123.9 lb

## 2024-01-02 DIAGNOSIS — I2510 Atherosclerotic heart disease of native coronary artery without angina pectoris: Secondary | ICD-10-CM

## 2024-01-02 DIAGNOSIS — E785 Hyperlipidemia, unspecified: Secondary | ICD-10-CM

## 2024-01-02 DIAGNOSIS — Z8249 Family history of ischemic heart disease and other diseases of the circulatory system: Secondary | ICD-10-CM | POA: Diagnosis not present

## 2024-01-02 NOTE — Patient Instructions (Signed)
 Medication Instructions:  Your physician recommends that you continue on your current medications as directed. Please refer to the Current Medication list given to you today.  *If you need a refill on your cardiac medications before your next appointment, please call your pharmacy*  Lab Work: FASTING Lipids (NMR and Lpa) this week If you have labs (blood work) drawn today and your tests are completely normal, you will receive your results only by: MyChart Message (if you have MyChart) OR A paper copy in the mail If you have any lab test that is abnormal or we need to change your treatment, we will call you to review the results.  Follow-Up: At [facility], you and your health needs are our priority.  As part of our continuing mission to provide you with exceptional heart care, our providers are all part of one team.  This team includes your primary Cardiologist (physician) and Advanced Practice Providers or APPs (Physician Assistants and Nurse Practitioners) who all work together to provide you with the care you need, when you need it.  Your next appointment:   With Dr. Maximo Spar after lab results

## 2024-01-02 NOTE — Progress Notes (Signed)
 LIPID CLINIC CONSULT NOTE  Chief Complaint:  Dyslipidemia  Primary Care Physician: Tena Feeling, MD  Primary Cardiologist:  Ahmad Alert, MD  HPI:  Jasmine Green is a 63 y.o. female who is being seen today for the evaluation of dyslipidemia at the request of Nahser, Lela Purple, MD. pleasant 63 year old female who was referred by Dr. Alroy Aspen for evaluation management of dyslipidemia in the context of coronary artery disease.  She has a family history of heart disease including her father who had early onset heart disease at age 20.  She had a calcium  score in 2018 which was 0 but then underwent CT coronary angiography in 2023 for chest pain and was found to have a calcium  score of 2, 63rd percentile for age and sex matched controls.  Aortic atherosclerosis was noted and additional imaging as shown aortoiliac atherosclerosis as well.  She has been hesitant to be on any lipid-lowering therapies.  She did try rosuvastatin  10 mg daily back in February but reported myalgias and stopped the medication.  She is interested in diet and lifestyle modification primarily however I would advise her target LDL to be less than 70.  Recent lipids in January showed total cholesterol 200 with HDL 63, triglycerides 75 and LDL 123.  Weight is appropriate and diet is healthy.  She does a decent amount of exercise and is active.  It is hard to imagine that she will be able to lower her cholesterol by the requisite 50 mg/dL with lifestyle alone.  PMHx:  Past Medical History:  Diagnosis Date   Basal cell carcinoma    face, adomen   IBS (irritable bowel syndrome)     Past Surgical History:  Procedure Laterality Date   ABDOMINAL HYSTERECTOMY     AUGMENTATION MAMMAPLASTY     bilateral retro pectoral saline implants 1996   CESAREAN SECTION  1989, 1991, 1994   LAPAROSCOPIC CHOLECYSTECTOMY  2006   LAPAROSCOPIC HYSTERECTOMY  2002    FAMHx:  Family History  Problem Relation Age of Onset   Coronary artery  disease Father 85       MI age 75, died of squamous cell cancer   Hyperlipidemia Father    Breast cancer Mother    Colon cancer Neg Hx    Stomach cancer Neg Hx     SOCHx:   reports that she has never smoked. She has never used smokeless tobacco. She reports current alcohol use of about 2.0 standard drinks of alcohol per week. She reports that she does not use drugs.  ALLERGIES:  Allergies  Allergen Reactions   Codeine     dizzy    ROS: Pertinent items noted in HPI and remainder of comprehensive ROS otherwise negative.  HOME MEDS: Current Outpatient Medications on File Prior to Visit  Medication Sig Dispense Refill   Calcium  Citrate 250 MG TABS      cholecalciferol (VITAMIN D3) 25 MCG (1000 UNIT) tablet Take 1,000 Units by mouth daily.     Estradiol 0.75 MG/1.25 GM (0.06%) topical gel Place 1.25 g onto the skin 3 (three) times a week.      magnesium oxide (MAG-OX) 400 MG tablet Take 400 mg by mouth daily.     nitroGLYCERIN  (NITROSTAT ) 0.4 MG SL tablet Place 1 tablet (0.4 mg total) under the tongue every 5 (five) minutes as needed for chest pain. 25 tablet 3   rosuvastatin  (CRESTOR ) 10 MG tablet Take 1 tablet (10 mg total) by mouth daily. (Patient not taking: Reported  on 04/13/2022) 90 tablet 3   No current facility-administered medications on file prior to visit.    LABS/IMAGING: No results found for this or any previous visit (from the past 48 hours). No results found.  LIPID PANEL:    Component Value Date/Time   CHOL 221 (H) 04/11/2022 0747   TRIG 126 04/11/2022 0747   HDL 72 04/11/2022 0747   CHOLHDL 3.1 04/11/2022 0747   LDLCALC 127 (H) 04/11/2022 0747    WEIGHTS: Wt Readings from Last 3 Encounters:  01/02/24 123 lb 14.4 oz (56.2 kg)  04/13/22 123 lb 3.2 oz (55.9 kg)  09/23/21 128 lb 6.4 oz (58.2 kg)    VITALS: BP 128/74   Pulse 61   Ht 5\' 3"  (1.6 m)   Wt 123 lb 14.4 oz (56.2 kg)   SpO2 97%   BMI 21.95 kg/m    EXAM: Deferred  EKG: Deferred  ASSESSMENT: Dyslipidemia, goal LDL less than 70 CAC score of 2, 63rd percentile (2023) Aortic and aortoiliac atherosclerosis Strong family history of premature coronary artery disease in her father  PLAN: 1.   Jasmine Green has a persistent dyslipidemia with likely genetic component given adequate lifestyle and diet management.  She is hesitant to take statins and tried rosuvastatin  10 mg daily with side effects.  She would like to further understand her risk and is interested in repeat blood work.  Will get a lipid NMR and LP(a).  Perhaps this will further inform her to help make a decision regarding whether additional therapeutic lipid-lowering would be beneficial.  I will contact you with those results and we will plan accordingly.  Hazle Lites, MD, [facility], FNLA, FACP  [place]  [facility]  Medical Director of the Advanced Lipid Disorders &  Cardiovascular Risk Reduction Clinic Diplomate of the American Board of Clinical Lipidology Attending Cardiologist  Direct Dial: [phone]  Fax: [phone]  Website:  [email or web address] 01/02/2024, 10:35 AM

## 2024-01-16 ENCOUNTER — Telehealth (HOSPITAL_BASED_OUTPATIENT_CLINIC_OR_DEPARTMENT_OTHER): Payer: Self-pay | Admitting: *Deleted

## 2024-01-16 NOTE — Telephone Encounter (Signed)
 Patient came by the office asking if she could get CBC w/ diff ordered when she had her lipid panel done.  Explained to patient I could reach out to Dr Maximo Spar (not at [facility] location today) to see about getting order for CBC, patient having fatigue.  Patient declined and stated she would just go to her husbands office and have it done.

## 2024-01-17 ENCOUNTER — Ambulatory Visit (HOSPITAL_BASED_OUTPATIENT_CLINIC_OR_DEPARTMENT_OTHER): Payer: Self-pay | Admitting: Internal Medicine

## 2024-01-17 LAB — NMR, LIPOPROFILE
Cholesterol, Total: 215 mg/dL — ABNORMAL HIGH (ref 100–199)
HDL Particle Number: 40.3 umol/L (ref >=30.5)
HDL-C: 78 mg/dL (ref >39)
LDL Particle Number: 1142 nmol/L — ABNORMAL HIGH (ref <1000)
LDL Size: 21.8 nm (ref >20.5)
LDL-C (NIH Calc): 121 mg/dL — ABNORMAL HIGH (ref 0–99)
LP-IR Score: <25 (ref <=45)
Small LDL Particle Number: 292 nmol/L (ref <=527)
Triglycerides: 92 mg/dL (ref 0–149)

## 2024-01-17 LAB — LIPOPROTEIN A (LPA): Lipoprotein (a): <8.4 nmol/L (ref <75.0)

## 2024-01-23 ENCOUNTER — Telehealth: Payer: Self-pay | Admitting: Internal Medicine

## 2024-01-23 MED ORDER — ATORVASTATIN CALCIUM 40 MG PO TABS: 40.0000 mg | ORAL_TABLET | Freq: Every evening | ORAL | 3 refills | Status: DC

## 2024-01-23 NOTE — Telephone Encounter (Signed)
 Pt returning call, requesting cb

## 2024-01-23 NOTE — Telephone Encounter (Signed)
 Pt c/o medication issue:  1. Name of Medication: atorvastatin (LIPITOR) 40 MG tablet   2. How are you currently taking this medication (dosage and times per day)? Has not picked up yet  3. Are you having a reaction (difficulty breathing--STAT)?   4. What is your medication issue? Wants to start off on lower dose like 20mg . Requesting cb to discuss

## 2024-01-23 NOTE — Telephone Encounter (Signed)
Attempted phone call to pt and left voicemail message to contact triage at [phone]. 

## 2024-01-23 NOTE — Telephone Encounter (Signed)
 Spoke with pt who requests that she start Atorvastatin at 20mg  instead of 40mg .  Pt states she would rather start on a lower dosage and increase as she does not feel her Lipid numbers warrant this dose.  Discussed importance of preventative therapy. Pt advised will forward to Dr Maximo Spar who is not in the office this week for review.  Pt verbalizes understanding and thanked Charity fundraiser for the call.

## 2024-01-25 ENCOUNTER — Other Ambulatory Visit: Payer: Self-pay

## 2024-01-25 MED ORDER — ATORVASTATIN CALCIUM 40 MG PO TABS: 20.0000 mg | ORAL_TABLET | Freq: Every evening | ORAL | Status: AC

## 2024-01-25 NOTE — Telephone Encounter (Signed)
 My Chart message sent to the pt with Dr [facility] recommendations.

## 2024-03-08 ENCOUNTER — Other Ambulatory Visit (HOSPITAL_COMMUNITY): Payer: Self-pay | Admitting: Internal Medicine

## 2024-03-08 DIAGNOSIS — F0781 Postconcussional syndrome: Secondary | ICD-10-CM

## 2024-03-08 DIAGNOSIS — R519 Headache, unspecified: Secondary | ICD-10-CM

## 2024-03-11 ENCOUNTER — Encounter (HOSPITAL_COMMUNITY): Payer: Self-pay

## 2024-03-11 ENCOUNTER — Ambulatory Visit (HOSPITAL_COMMUNITY)

## 2024-03-12 ENCOUNTER — Ambulatory Visit
Admission: RE | Admit: 2024-03-12 | Discharge: 2024-03-12 | Disposition: A | Discharge status: Home / Self Care | Source: Ambulatory Visit | Attending: Internal Medicine | Admitting: Internal Medicine

## 2024-03-12 DIAGNOSIS — R519 Headache, unspecified: Secondary | ICD-10-CM

## 2024-03-12 DIAGNOSIS — F0781 Postconcussional syndrome: Secondary | ICD-10-CM

## 2024-04-17 ENCOUNTER — Encounter: Payer: Self-pay | Admitting: Internal Medicine

## 2024-04-29 ENCOUNTER — Ambulatory Visit: Admitting: Internal Medicine

## 2024-05-06 ENCOUNTER — Ambulatory Visit: Admitting: Internal Medicine

## 2024-05-15 ENCOUNTER — Other Ambulatory Visit: Payer: Self-pay | Admitting: Gastroenterology

## 2024-05-15 DIAGNOSIS — D1803 Hemangioma of intra-abdominal structures: Secondary | ICD-10-CM

## 2024-05-21 ENCOUNTER — Ambulatory Visit
Admission: RE | Admit: 2024-05-21 | Discharge: 2024-05-21 | Disposition: A | Discharge status: Home / Self Care | Source: Ambulatory Visit | Attending: Gastroenterology | Admitting: Gastroenterology

## 2024-05-21 DIAGNOSIS — D1803 Hemangioma of intra-abdominal structures: Secondary | ICD-10-CM

## 2024-07-31 ENCOUNTER — Encounter (HOSPITAL_BASED_OUTPATIENT_CLINIC_OR_DEPARTMENT_OTHER): Payer: Self-pay | Admitting: Internal Medicine

## 2024-09-05 ENCOUNTER — Other Ambulatory Visit (HOSPITAL_BASED_OUTPATIENT_CLINIC_OR_DEPARTMENT_OTHER): Payer: Self-pay | Admitting: *Deleted

## 2024-09-05 ENCOUNTER — Encounter (HOSPITAL_BASED_OUTPATIENT_CLINIC_OR_DEPARTMENT_OTHER): Payer: Self-pay

## 2024-09-05 DIAGNOSIS — E782 Mixed hyperlipidemia: Secondary | ICD-10-CM

## 2024-09-05 DIAGNOSIS — E785 Hyperlipidemia, unspecified: Secondary | ICD-10-CM

## 2024-09-05 DIAGNOSIS — I2510 Atherosclerotic heart disease of native coronary artery without angina pectoris: Secondary | ICD-10-CM

## 2024-09-05 DIAGNOSIS — Z8249 Family history of ischemic heart disease and other diseases of the circulatory system: Secondary | ICD-10-CM

## 2024-09-05 NOTE — Progress Notes (Signed)
 " Cardiology Office Note   Date:  09/11/2024  ID:  ANAYA BOVEE, DOB 10-01-1960, MRN [id number] PCP: Dwight Trula SQUIBB, MD  [facility] Providers Cardiologist:  Aleene Passe, MD (Inactive)     PMH Dyslipidemia Coronary artery calcification Coronary CTA 09/09/2021 with CAC score of 2 (63rd percentile) Statin myalgia Aortic atherosclerosis  Referred to Advanced Lipid disorder clinic and seen by Dr. Mona 01/02/2024.  She has a family history of heart disease including her father who had early onset heart disease at age 52.  She had a calcium  score of 0 in 2018 but underwent coronary CTA in 2023 for chest pain and was found to have a calcium  score of 2.  She has been hesitant to be on lipid-lowering therapies. She tried rosuvastatin  10 mg daily in February 2025 but reported myalgias and stopped the medication.  She is interested in diet and lifestyle modification primarily however she was advised target LDL would be 70 or lower.  Weight is appropriate and diet is healthy.  She does a decent amount of exercise and is active.  Lipoprotein a tested and is not elevated.  NMR 01/16/2024 revealed LDL particle #1142, LDL-C 878, HDL-C 78, triglycerides 92, total cholesterol 215, and small LDL-P 292. She was advised to start atorvastatin  40 mg at bedtime.   History of Present Illness  History of Present Illness ROSELLA CRANDELL is a very pleasant 64 year old female who presents today for follow-up on her cholesterol management. She takes atorvastatin  20 mg twice a week and is awaiting recent lipid panel and liver function test results collected earlier today. Coronary CT 08/2021 revealed calcium  score of 2 with calcification in LCx which we discussed in detail. Despite regular exercise, she has osteoporosis and has received one infusion of Reclast approximately one year ago. She participates in swimming, weight lifting and pilates with no concerning cardiac symptoms. She generally eats a healthy diet  with  occasional splurges. No chest pain, orthopnea, PND, dyspnea, palpitations, edema, presyncope or syncope.    ROS: See HPI  Studies Reviewed EKG Interpretation Date/Time:  Wednesday September 11 2024 13:32:47 EST Ventricular Rate:  66 PR Interval:  138 QRS Duration:  74 QT Interval:  386 QTC Calculation: 404 R Axis:   56  Text Interpretation: Normal sinus rhythm Low voltage QRS When compared with ECG of 08-Jun-2017 06:51, No acute changes Confirmed by Percy Browning [phone]) on 09/11/2024 1:36:27 PM     Lipoprotein (a)  Date/Time Value Ref Range Status  01/16/2024 08:19 AM <8.4 <75.0 nmol/L Final    Comment:    **Results verified by repeat testing** Note:  Values greater than or equal to 75.0 nmol/L may        indicate an independent risk factor for CHD,        but must be evaluated with caution when applied        to non-Caucasian populations due to the        influence of genetic factors on Lp(a) across        ethnicities.     Risk Assessment/Calculations           Physical Exam VS:  BP 108/70 (BP Location: Left Arm, Patient Position: Sitting, Cuff Size: Normal)   Pulse 66   Ht 5' 3 (1.6 m)   Wt 124 lb (56.2 kg)   SpO2 97%   BMI 21.97 kg/m    Wt Readings from Last 3 Encounters:  09/11/24 124 lb (56.2 kg)  01/02/24 123 lb 14.4 oz (56.2 kg)  04/13/22 123 lb 3.2 oz (55.9 kg)    GEN: Well nourished, well developed in no acute distress NECK: No JVD; No carotid bruits CARDIAC: RRR, no murmurs, rubs, gallops RESPIRATORY:  Clear to auscultation without rales, wheezing or rhonchi  ABDOMEN: Soft, non-tender, non-distended EXTREMITIES:  No edema; No deformity   ASSESSMENT AND PLAN Assessment & Plan Coronary artery calcification Cardiac risk   Family history CAD Coronary CT 09/09/2021 revealed CAC Score of 2 (63rd percentile) with trivial calcification in LCx, no significant plaque or stenosis. Discussed risk of progression increases with age and management  focuses on prevention and risk reduction. Generally lives healthy lifestyle including regular exercise and heart healthy diet and no alcohol consumption. EKG reveals NSR with no acute ST/T abnormality.  She denies chest pain, dyspnea, or other symptoms concerning for angina.  No indication for further ischemic evaluation at this time. - Awaiting lipid results for further recommendations - Continue atrovastatin -Check ApoB for further risk stratification - Heart healthy diet avoiding processed foods, saturated fat, sugar, and other simple carbohydrates encouraged - Be as physically active as possible every day and aim for at least 150 minutes of moderate intensity exercise each week  Dyslipidemia LDL goal < 70 NMR 01/16/24 with LDL particle #1142, LDL-C 878, HDL-C 78, triglycerides 92, total cholesterol 215 small LDL-P 292.  LPA is not elevated.  Since that time she has been taking atorvastatin  20 mg 2 days/week.  She is generally concerned about being on statin therapy and asked for clarification of the lipo profile including analysis based on ranges provided through the report. Advised LDL goal 70 or lower based on coronary calcification. Discussed statin therapy and ApoB as a cardiovascular risk marker, emphasizing LDL control to prevent cardiovascular events.  She is generally very active and eats a heart healthy diet. -Continue regular physical activity and heart healthy diet - We will get apolipoprotein B test - Awaiting ALT and lipid panel completed earlier today - Continue atorvastatin  20 mg but increase to three times a week        Dispo: TBD based on test results  Signed, Rosaline Bane, NP-C "

## 2024-09-11 ENCOUNTER — Ambulatory Visit (INDEPENDENT_AMBULATORY_CARE_PROVIDER_SITE_OTHER): Admitting: Nurse Practitioner

## 2024-09-11 ENCOUNTER — Encounter (HOSPITAL_BASED_OUTPATIENT_CLINIC_OR_DEPARTMENT_OTHER): Payer: Self-pay | Admitting: Nurse Practitioner

## 2024-09-11 ENCOUNTER — Encounter (HOSPITAL_BASED_OUTPATIENT_CLINIC_OR_DEPARTMENT_OTHER): Payer: Self-pay

## 2024-09-11 VITALS — BP 108/70 | HR 66 | Ht 63.0 in | Wt 124.0 lb

## 2024-09-11 DIAGNOSIS — I2510 Atherosclerotic heart disease of native coronary artery without angina pectoris: Secondary | ICD-10-CM

## 2024-09-11 DIAGNOSIS — E785 Hyperlipidemia, unspecified: Secondary | ICD-10-CM

## 2024-09-11 DIAGNOSIS — Z7189 Other specified counseling: Secondary | ICD-10-CM | POA: Diagnosis not present

## 2024-09-11 DIAGNOSIS — Z8249 Family history of ischemic heart disease and other diseases of the circulatory system: Secondary | ICD-10-CM

## 2024-09-11 LAB — LIPID PANEL
Chol/HDL Ratio: 2.4 ratio (ref 0.0–4.4)
Cholesterol, Total: 165 mg/dL (ref 100–199)
HDL: 69 mg/dL
LDL Chol Calc (NIH): 82 mg/dL (ref 0–99)
Triglycerides: 76 mg/dL (ref 0–149)
VLDL Cholesterol Cal: 14 mg/dL (ref 5–40)

## 2024-09-11 LAB — ALT: ALT: 26 IU/L (ref 0–32)

## 2024-09-11 NOTE — Patient Instructions (Signed)
 Medication Instructions:  Your physician recommends that you continue on your current medications as directed. Please refer to the Current Medication list given to you today.  *If you need a refill on your cardiac medications before your next appointment, please call your pharmacy*  Lab Work: Your physician recommends that you return for lab work in: TODAY   ApoB  If you have labs (blood work) drawn today and your tests are completely normal, you will receive your results only by: MyChart Message (if you have MyChart) OR A paper copy in the mail If you have any lab test that is abnormal or we need to change your treatment, we will call you to review the results.  Testing/Procedures: NONE  Follow-Up: TO BE DETERMINED

## 2024-09-12 ENCOUNTER — Ambulatory Visit (HOSPITAL_BASED_OUTPATIENT_CLINIC_OR_DEPARTMENT_OTHER): Payer: Self-pay | Admitting: Nurse Practitioner

## 2024-09-12 LAB — APOLIPOPROTEIN B: Apolipoprotein B: 60 mg/dL
